# Patient Record
Sex: Male | Born: 1955 | Race: White | Hispanic: No | Marital: Single | Smoking: Never smoker
Health system: Southern US, Community
[De-identification: ages and names within clinical notes are randomized; demographics above are authoritative.]

## PROBLEM LIST (undated history)

## (undated) DIAGNOSIS — K219 Gastro-esophageal reflux disease without esophagitis: Secondary | ICD-10-CM

## (undated) DIAGNOSIS — T8859XA Other complications of anesthesia, initial encounter: Secondary | ICD-10-CM

## (undated) DIAGNOSIS — I10 Essential (primary) hypertension: Secondary | ICD-10-CM

## (undated) DIAGNOSIS — C61 Malignant neoplasm of prostate: Secondary | ICD-10-CM

## (undated) HISTORY — PX: FRACTURE SURGERY: SHX138

## (undated) HISTORY — PX: KNEE SURGERY: SHX244

## (undated) HISTORY — DX: Malignant neoplasm of prostate: C61

## (undated) HISTORY — PX: LITHOTRIPSY: SUR834

---

## 2004-10-12 ENCOUNTER — Ambulatory Visit: Payer: Self-pay | Admitting: Internal Medicine

## 2004-10-12 LAB — HM COLONOSCOPY

## 2007-01-22 ENCOUNTER — Encounter: Payer: Self-pay | Admitting: Family Medicine

## 2007-01-22 LAB — CONVERTED CEMR LAB
BUN: 21 mg/dL
Calcium: 8.8 mg/dL
Cholesterol: 154 mg/dL
HDL: 37 mg/dL
PSA: 1.5 ng/mL
Potassium: 4.7 meq/L
Sodium: 141 meq/L
Triglycerides: 115 mg/dL

## 2008-03-08 ENCOUNTER — Ambulatory Visit: Payer: Self-pay | Admitting: Family Medicine

## 2008-03-10 ENCOUNTER — Encounter: Payer: Self-pay | Admitting: Family Medicine

## 2008-03-10 LAB — CONVERTED CEMR LAB
Cholesterol: 144 mg/dL
Creatinine, Ser: 1.1 mg/dL
HDL: 35 mg/dL
LDL Cholesterol: 93 mg/dL
Triglycerides: 82 mg/dL

## 2009-08-16 ENCOUNTER — Ambulatory Visit: Payer: Self-pay | Admitting: Family Medicine

## 2009-08-16 DIAGNOSIS — K219 Gastro-esophageal reflux disease without esophagitis: Secondary | ICD-10-CM | POA: Insufficient documentation

## 2009-08-16 DIAGNOSIS — H579 Unspecified disorder of eye and adnexa: Secondary | ICD-10-CM | POA: Insufficient documentation

## 2009-08-16 DIAGNOSIS — J309 Allergic rhinitis, unspecified: Secondary | ICD-10-CM | POA: Insufficient documentation

## 2009-08-16 DIAGNOSIS — K8020 Calculus of gallbladder without cholecystitis without obstruction: Secondary | ICD-10-CM | POA: Insufficient documentation

## 2009-09-04 ENCOUNTER — Encounter: Payer: Self-pay | Admitting: Family Medicine

## 2009-09-06 ENCOUNTER — Encounter (INDEPENDENT_AMBULATORY_CARE_PROVIDER_SITE_OTHER): Payer: Self-pay | Admitting: *Deleted

## 2009-12-03 ENCOUNTER — Telehealth (INDEPENDENT_AMBULATORY_CARE_PROVIDER_SITE_OTHER): Payer: Self-pay | Admitting: *Deleted

## 2009-12-11 ENCOUNTER — Ambulatory Visit: Payer: Self-pay | Admitting: Family Medicine

## 2009-12-12 LAB — CONVERTED CEMR LAB
ALT: 39 units/L (ref 0–53)
Alkaline Phosphatase: 60 units/L (ref 39–117)
Bilirubin, Direct: 0.2 mg/dL (ref 0.0–0.3)
Calcium: 9.4 mg/dL (ref 8.4–10.5)
Chloride: 103 meq/L (ref 96–112)
Creatinine, Ser: 1.1 mg/dL (ref 0.4–1.5)
HDL: 37.2 mg/dL — ABNORMAL LOW (ref >39.00)
LDL Cholesterol: 124 mg/dL — ABNORMAL HIGH (ref 0–99)
PSA: 4.63 ng/mL — ABNORMAL HIGH (ref 0.10–4.00)
Sodium: 140 meq/L (ref 135–145)
Total Bilirubin: 1.2 mg/dL (ref 0.3–1.2)
Total CHOL/HDL Ratio: 5
Triglycerides: 78 mg/dL (ref 0.0–149.0)

## 2009-12-14 ENCOUNTER — Ambulatory Visit: Payer: Self-pay | Admitting: Family Medicine

## 2010-02-12 NOTE — Progress Notes (Signed)
----   Converted from flag ---- ---- 11/30/2009 1:04 PM, Crawford Givens MD wrote: cmet lipid v17.3 psa v76.49  ---- 11/30/2009 12:22 PM, Liane Comber CMA (AAMA) wrote: Lab orders please! Good Morning! This pt is scheduled for cpx labs Tuesday, which labs to draw and dx codes to use? Thanks Tasha ------------------------------

## 2010-02-12 NOTE — Assessment & Plan Note (Signed)
Summary: NEW PT TO ESTABH/DLO   Vital Signs:  Patient profile:   55 year old male Height:      69 inches Weight:      177 pounds BMI:     26.23 Temp:     98.2 degrees F oral Pulse rate:   84 / minute Pulse rhythm:   regular BP sitting:   122 / 70  (left arm) Cuff size:   regular  Vitals Entered By: Delilah Shan CMA Duncan Dull) (August 16, 2009 9:49 AM) CC: New Patient to Establish   History of Present Illness: Eye watering.  Occ dry sensation.  Going on for the last few months.  Has been cutting a lot of wood for wood stove.  No vision change.  No pain.   Requesting records.    Preventive Screening-Counseling & Management  Alcohol-Tobacco     Smoking Status: quit  Caffeine-Diet-Exercise     Does Patient Exercise: yes      Drug Use:  no.    Allergies (verified): No Known Drug Allergies  Past History:  Family History: Last updated: 08/16/2009 Family History of Arthritis, other blood relative Family History Breast cancer 1st degree relative <50, parents Family History of Colon CA 1st degree relative <60, grandparents Family History of Prostate CA 1st degree relative <50, parents and other blood relative Family History of Stroke F 1st degree relative <60, parents and other blood relative Family History of Heart Disease, parents and grandparents  M dead breast CA F dead, possible prostate CA, h/o heart disease, possible diverticulitis s/p resection with colostomy  Social History: Last updated: 08/16/2009 Marital Status: Married, since 1997 Children: no kids Occupation: Pipe Audiological scientist, Sales executive Former Smoker, 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 Family History of Arthritis, other blood  relative Family History Breast cancer 1st degree relative <50, parents Family History of Colon CA 1st degree relative <60, grandparents Family History of Prostate CA 1st degree relative <50, parents and other blood relative Family History of Stroke F 1st degree relative <60, parents and other blood relative Family History of Heart Disease, parents and grandparents  M dead breast CA F dead, possible prostate CA, h/o heart disease, possible diverticulitis s/p resection with colostomy  Social History: Reviewed history and no changes required. 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  Otherwise negative.    Physical Exam  General:  GEN: nad, alert and oriented HEENT: mucous membranes moist, TM wnl bilateral, nasal epithelium mildly injected, OP wnl, fundus wnl bilateral, EOMI bilateral, minimal conjuctival injection bilateral- limbus sparing, no FB. no purulent discharge  NECK: supple w/o LA CV: rrr.  no murmur PULM: ctab, no inc wob ABD: soft, +bs EXT: no edema SKIN: no acute rash    Impression & Recommendations:  Problem # 1:  UNSPECIFIED DISORDER OF EYE (ICD-379.90) Potential allergic cause.  Use dust mask to see if that helps and consider OTC zaditor as needed.   consider nasal steroids if not relief after  that.   Complete Medication List: 1)  Ranitidine Hcl 150 Mg Tabs (Ranitidine hcl) .... Take 1 tablet by mouth once a day 2)  Cetirizine Hcl 10 Mg Tabs (Cetirizine hcl) .... As needed 3)  Multivitamins Tabs (Multiple vitamin) .... Take 1 tablet by mouth once a day 4)  Vitamin C 500 Mg Tabs (Ascorbic acid) .... Take 1 tablet by mouth once a day 5)  Aspirin 81 Mg Tabs (Aspirin)  .... Take 1 tablet by mouth once a day 6)  Flax Oil (Flaxseed (linseed)) .... Take 1 tablet by mouth once a day 7)  Fish Oil Oil (Fish oil) .Marland Kitchen.. 1200 mg.   takes 2 daily  Patient Instructions: 1)  See if using the mask helps with your eye watering.  If not, try zaditor (or another over the counter med).  Let me know if you continue to have trouble.  I would get a physical this winter or spring.     Preventive Care Screening  Colonoscopy:    Date:  01/14/2003    Results:  normal

## 2010-02-12 NOTE — Assessment & Plan Note (Signed)
Summary: CPX   Vital Signs:  Patient profile:   56 year old male Height:      69 inches Weight:      176.50 pounds BMI:     26.16 Temp:     98.5 degrees F oral Pulse rate:   68 / minute Pulse rhythm:   regular BP sitting:   110 / 86  (left arm) Cuff size:   regular  Vitals Entered By: Delilah Shan CMA Duncan Dull) (December 14, 2009 8:54 AM) CC: CPX   History of Present Illness: CPE- See plan  Occ burping and 'sickly feeling' in throat.  Inc in indigestion.  Better with ranitidine at night.  Sx change with diet.    Elevated PSA with FH of prostate CA (father, in late 68s).  Labs reviewed with patient.  This is the first elevation of PSA for patient.    Allergies: No Known Drug Allergies  Past History:  Past Surgical History: Last updated: 08/16/2009 2005   Kidney stone  Family History: Last updated: 08/16/2009 Family History of Arthritis, other blood relative Family History Breast cancer 1st degree relative <50, parents Family History of Colon CA 1st degree relative <60, grandparents Family History of Prostate CA 1st degree relative <50, parents and other blood relative Family History of Stroke F 1st degree relative <60, parents and other blood relative Family History of Heart Disease, parents and grandparents  M dead breast CA F dead, possible prostate CA, h/o heart disease, possible diverticulitis s/p resection with colostomy  Social History: Last updated: 12/14/2009 Marital Status: Married, since 1997 Children: no kids Occupation: Pipe Audiological scientist, Sales executive Former Smoker, 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  Otherwise negative.    Physical Exam  General:  GEN: nad, alert and oriented HEENT: mucous membranes moist NECK: supple w/o LA CV: rrr.  no murmur PULM: ctab, no inc wob ABD: soft, +bs EXT: no edema SKIN: no acute rash  Rectal:  No external abnormalities noted. Normal sphincter tone. No rectal masses or tenderness. Prostate:  Prostate gland firm and smooth without nodularity, tenderness, mass, asymmetry or induration.   Impression & Recommendations:  Problem # 1:  Preventive Health Care (ICD-V70.0) stool heme neg. Flu and tdap done today.  healthy habits encouraged.  labs reviewed with patient.   Problem # 2:  GERD (ICD-530.81) D/w patient [id number] of head of bed and using randitidine two times a day.  D/w patient [id number] avoidance.  He understood.  Call back as needed.  His updated medication list for this problem includes:    Ranitidine Hcl 150 Mg Tabs (Ranitidine hcl) .Marland Kitchen... Take 1 tablet by mouth once a day  Problem # 3:  SPECIAL SCREENING MALIGNANT NEOPLASM OF PROSTATE (ICD-V76.44) Given FH, will refer to URO.  App uro input.  He understands possible false elevations with PSA, though there is a family history of CA.   Orders: Urology Referral (Urology)  Complete Medication List: 1)  Ranitidine Hcl 150 Mg Tabs (Ranitidine hcl) .... Take 1 tablet by mouth once a day 2)  Cetirizine  Hcl 10 Mg Tabs (Cetirizine hcl) .... As needed 3)  Multivitamins Tabs (Multiple vitamin) .... Take 1 tablet by mouth once a day 4)  Vitamin C 500 Mg Tabs (Ascorbic acid) .... Take 1 tablet by mouth once a day 5)  Aspirin 81 Mg Tabs (Aspirin) .... Take 1 tablet by mouth once a day 6)  Flax Oil (Flaxseed (linseed)) .... Take 1 tablet by mouth once a day 7)  Fish Oil Oil (Fish oil) .Marland Kitchen.. 1200 mg.   takes 2 daily  Other Orders: Tdap => 55yrs IM  (16109) Admin 1st Vaccine (60454) Admin 1st Vaccine (09811) Flu Vaccine 30yrs + [phone])  Patient Instructions: 1)  I would elevate the head of your bed and take the ranitidine two times a day.   2)  See Shirlee Limerick about your referral before your leave today.   3)  Take care.  I was glad to see you today.    Orders Added: 1)  Est. Patient 40-64 years [99396] 2)  Est. Patient Level III [99213] 3)  Urology Referral [Urology] 4)  Tdap => 75yrs IM [90715] 5)  Admin 1st Vaccine [90471] 6)  Admin 1st Vaccine [90471] 7)  Flu Vaccine 65yrs + [29562]   Immunizations Administered:  Tetanus Vaccine:    Vaccine Type: Tdap    Site: left deltoid    Mfr: GlaxoSmithKline    Dose: 0.5 ml    Route: IM    Given by: Delilah Shan CMA (AAMA)    Exp. Date: 11/10/2011    Lot #: [id number]    VIS given: 12/01/07 version given December 14, 2009.   Immunizations Administered:  Tetanus Vaccine:    Vaccine Type: Tdap    Site: left deltoid    Mfr: GlaxoSmithKline    Dose: 0.5 ml    Route: IM    Given by: Delilah Shan CMA (AAMA)    Exp. Date: 11/10/2011    Lot #: [id number]    VIS given: 12/01/07 version given December 14, 2009.  Current Allergies (reviewed today): No known allergies  Flu Vaccine Consent Questions     Do you have a history of severe allergic reactions to this vaccine? no    Any prior history of allergic reactions to egg and/or gelatin? no    Do you have a sensitivity to the preservative Thimersol? no    Do you have a past history of Guillan-Barre Syndrome? no    Do you currently have an acute febrile illness? no    Have you ever had a severe reaction to latex? no    Vaccine information given and explained to patient? yes    Are you currently pregnant? no    Lot Number:[id number]   Exp Date:07/13/2010   Site Given  Left Deltoid IM.lbflu Lugene Fuquay CMA (AAMA)  December 14, 2009 11:06 AM

## 2010-02-12 NOTE — Miscellaneous (Signed)
  Clinical Lists Changes  Observations: Added new observation of HGB: 14.9 g/dL (04/54/0981 19:14) Added new observation of PSA: 1.4 ng/mL (03/10/2008 16:45) Added new observation of CREATININE: 1.10 mg/dL (78/29/5621 30:86) Added new observation of BUN: 25 mg/dL (57/84/6962 95:28) Added new observation of BG RANDOM: 83 mg/dL (41/32/4401 02:72) Added new observation of LDL: 93 mg/dL (53/66/4403 47:42) Added new observation of HDL: 35 mg/dL (59/56/3875 64:33) Added new observation of TRIGLYC TOT: 82 mg/dL (29/51/8841 66:06) Added new observation of CHOLESTEROL: 144 mg/dL (30/16/0109 32:35) Added new observation of PSA: 1.5 ng/mL (01/22/2007 16:45) Added new observation of CALCIUM: 8.8 mg/dL (57/32/2025 42:70) Added new observation of PROTEIN, TOT: 7.0 g/dL (62/37/6283 15:17) Added new observation of CREATININE: 1.12 mg/dL (61/60/7371 06:26) Added new observation of BUN: 21 mg/dL (94/85/4627 03:50) Added new observation of BG RANDOM: 77 mg/dL (09/38/1829 93:71) Added new observation of CL SERUM: 105 meq/L (01/22/2007 16:45) Added new observation of K SERUM: 4.7 meq/L (01/22/2007 16:45) Added new observation of NA: 141 meq/L (01/22/2007 16:45) Added new observation of LDL: 94 mg/dL (69/67/8938 10:17) Added new observation of HDL: 37 mg/dL (51/02/5850 77:82) Added new observation of TRIGLYC TOT: 115 mg/dL (42/35/3614 43:15) Added new observation of CHOLESTEROL: 154 mg/dL (40/08/6759 95:09)

## 2010-02-12 NOTE — Miscellaneous (Signed)
  Clinical Lists Changes  Observations: Added new observation of COLONOSCOPY: Diverticulosis without evidence of diverticular bleeding (10/12/2004 10:15)      Preventive Care Screening  Colonoscopy:    Date:  10/12/2004    Results:  Diverticulosis without evidence of diverticular bleeding

## 2010-12-28 ENCOUNTER — Ambulatory Visit: Payer: Self-pay | Admitting: Orthopedic Surgery

## 2011-03-25 ENCOUNTER — Ambulatory Visit: Payer: Self-pay | Admitting: Orthopedic Surgery

## 2011-03-25 LAB — SEDIMENTATION RATE: Erythrocyte Sed Rate: 9 mm/hr (ref 0–20)

## 2011-04-01 ENCOUNTER — Ambulatory Visit: Payer: Self-pay | Admitting: Orthopedic Surgery

## 2013-01-21 ENCOUNTER — Ambulatory Visit: Payer: Self-pay | Admitting: Family Medicine

## 2013-01-23 ENCOUNTER — Ambulatory Visit: Payer: Self-pay | Admitting: Physician Assistant

## 2013-03-15 ENCOUNTER — Telehealth: Payer: Self-pay | Admitting: *Deleted

## 2013-03-25 NOTE — Telephone Encounter (Signed)
Open in error

## 2014-05-07 NOTE — Op Note (Signed)
PATIENT NAME:  Cody Rush, Cody Rush MR#:  [id number] DATE OF BIRTH:  1955/02/21  DATE OF PROCEDURE:  04/01/2011  PREOPERATIVE DIAGNOSIS: Left knee medial meniscal tear.   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   PROCEDURE:  Arthroscopic partial medial meniscectomy and chondroplasty of the medial femoral condyle and patellofemoral joint, left knee.   SURGEON:  Thornton Park, M.D.   ANESTHESIA: General with LMA plus local injection of the incision sites with 1% lidocaine plain and an intraarticular injection of 0.25% Marcaine with epinephrine x 20 mL.   SPECIMENS: None.   COMPLICATIONS: None.   INDICATIONS FOR PROCEDURE: Cody Rush is a 59 year old male who has had over five months of left knee pain. This prevented him from staying active with hiking and jogging. He had mechanical symptoms such as clicking and popping. The pain was isolated to the medial side. An MRI was suggestive of a medial meniscal tear. Given the longevity of his symptoms and the mechanical clicking and popping, the patient decided to proceed with arthroscopic partial medial meniscectomy.   The risks and benefits of the procedure were reviewed with the patient. He understands the risks include infection, bleeding, nerve or blood vessel injury, knee stiffness, osteoarthritis, persistent knee pain, the need for further surgery, deep venous thrombosis and pulmonary embolism as well as myocardial infarction, stroke, pneumonia, respiratory failure, and death.   PROCEDURE NOTE: The patient was brought to the operating room after I spoke with him in the preoperative area. I had signed the left leg with the word "yes" according to the right site protocol. I updated the patient's history and physical. I had verbally confirmed with the patient that the left knee was the correct site of surgery, and this was confirmed with my office notes  and radiographic studies as well.   The patient was prepped and draped in sterile fashion after all bony prominences were adequately padded. He had undergone general anesthesia with LMA. A time-out was performed to verify the patient's name, date of birth, medical record number, correct site of surgery, and correct procedure to be performed. It was also used to verify the patient had received antibiotics and that all appropriate instruments, implants, and radiographic studies were available in the room. Once all in attendance were in agreement, the case began.   The patient had proposed arthroscopy sites drawn out with a surgical marker based on bony landmarks. These were pre-injected with 1% lidocaine plain for a total of 10 mL. The patient also had received 2 grams of Kefzol prior to the onset of the incision.    A #11 blade was used to establish inferior lateral and inferior medial portals. The arthroscope was placed through the inferolateral portal and a full diagnostic examination of the knee was performed. During this diagnostic examination an 18-gauge spinal needle was used to localize the medial portal and an 11 blade was used to make this incision.   The arthroscopic evaluation of the knee included the suprapatellar pouch, the patellofemoral joint, the medial and lateral gutters, the middle and lateral compartments, the intercondylar notch, and the posterior knee.   Findings on arthroscopy included a tear of the posterior horn of the medial meniscus extending into the body of the meniscus. This primarily involved an undersurface tear which was unstable. The patient also had extensive grade 2 and 3 changes of the medial femoral condyle and patellofemoral joint as well.   The attention was  turned to addressing the grade 3 chondral changes of the medial femoral condyle. Chondroplasty was performed to remove all loose edges of cartilage and any fraying of the medial femoral condyle. The medial  meniscus was then addressed using a straight basket and 4-0 resector shaver blade. The meniscus was then probed to ensure a stable rim. The meniscus was also contoured to avoid any sharp edges which may lead to further tearing. The patellofemoral joint then also underwent a chondroplasty to remove any loose or frayed pieces of cartilage. The knee was then copiously irrigated and lavaged. All arthroscopic instruments were then removed. 4-0 nylon sutures were placed in the medial and lateral portal incisions. A dry sterile dressing was applied. 20 mL of 0.25% Marcaine plain was injected intra-articularly at the conclusion of the case before a dry sterile bandage was applied. The patient was awakened and brought to the PAC-U in stable condition. I was scrubbed and present for the entire case and all sharp and instrument counts were correct at the conclusion of the case. I spoke with the patient's wife in the postoperative consultation room to let her know that the case had gone without complication and the patient was stable in the recovery room.    ____________________________ Timoteo Gaul, MD klk:[id number] D: 04/01/2011 11:03:37 ET T: 04/01/2011 12:35:13 ET JOB#: [id number]  cc: Timoteo Gaul, MD, <Dictator> Timoteo Gaul MD ELECTRONICALLY SIGNED 04/04/2011 9:19

## 2014-05-26 LAB — HEPATIC FUNCTION PANEL
ALK PHOS: 53 U/L (ref 25–125)
ALT: 29 U/L (ref 10–40)
AST: 27 U/L (ref 14–40)
Bilirubin, Total: 0.6 mg/dL

## 2014-05-26 LAB — CBC AND DIFFERENTIAL
HEMATOCRIT: 43 % (ref 41–53)
HEMOGLOBIN: 15.4 g/dL (ref 13.5–17.5)
NEUTROS ABS: 2 /uL
PLATELETS: 183 10*3/uL (ref 150–399)
WBC: 3.9 10*3/mL

## 2014-05-26 LAB — TSH: TSH: 2.19 u[IU]/mL (ref 0.41–5.90)

## 2014-05-26 LAB — BASIC METABOLIC PANEL
BUN: 19 mg/dL (ref 4–21)
Creatinine: 1.2 mg/dL (ref 0.6–1.3)
GLUCOSE: 90 mg/dL
POTASSIUM: 4.9 mmol/L (ref 3.4–5.3)
Sodium: 140 mmol/L (ref 137–147)

## 2014-05-26 LAB — LIPID PANEL
CHOLESTEROL: 148 mg/dL (ref 0–200)
HDL: 36 mg/dL (ref 35–70)
LDL Cholesterol: 94 mg/dL
LDl/HDL Ratio: 2.6
TRIGLYCERIDES: 92 mg/dL (ref 40–160)

## 2014-05-26 LAB — PSA: PSA: 2.8

## 2014-05-26 LAB — HM HEPATITIS C SCREENING LAB: HM Hepatitis Screen: NEGATIVE

## 2015-02-01 DIAGNOSIS — R030 Elevated blood-pressure reading, without diagnosis of hypertension: Secondary | ICD-10-CM | POA: Insufficient documentation

## 2015-02-01 DIAGNOSIS — Z87898 Personal history of other specified conditions: Secondary | ICD-10-CM | POA: Insufficient documentation

## 2015-02-01 DIAGNOSIS — K219 Gastro-esophageal reflux disease without esophagitis: Secondary | ICD-10-CM | POA: Insufficient documentation

## 2015-02-01 DIAGNOSIS — M722 Plantar fascial fibromatosis: Secondary | ICD-10-CM | POA: Insufficient documentation

## 2015-02-01 DIAGNOSIS — E785 Hyperlipidemia, unspecified: Secondary | ICD-10-CM | POA: Insufficient documentation

## 2015-02-01 DIAGNOSIS — J309 Allergic rhinitis, unspecified: Secondary | ICD-10-CM | POA: Insufficient documentation

## 2015-02-01 DIAGNOSIS — K5790 Diverticulosis of intestine, part unspecified, without perforation or abscess without bleeding: Secondary | ICD-10-CM | POA: Insufficient documentation

## 2015-02-01 DIAGNOSIS — K759 Inflammatory liver disease, unspecified: Secondary | ICD-10-CM | POA: Insufficient documentation

## 2015-02-01 DIAGNOSIS — Z87442 Personal history of urinary calculi: Secondary | ICD-10-CM | POA: Insufficient documentation

## 2015-03-07 ENCOUNTER — Encounter: Payer: Self-pay | Admitting: Family Medicine

## 2015-03-07 ENCOUNTER — Ambulatory Visit (INDEPENDENT_AMBULATORY_CARE_PROVIDER_SITE_OTHER): Payer: 59 | Admitting: Family Medicine

## 2015-03-07 VITALS — BP 130/68 | Temp 98.6°F | Resp 16 | Wt 185.0 lb

## 2015-03-07 DIAGNOSIS — C4491 Basal cell carcinoma of skin, unspecified: Secondary | ICD-10-CM | POA: Diagnosis not present

## 2015-03-07 NOTE — Progress Notes (Signed)
Patient ID: Cody Rush, male   DOB: 13-Jan-1956, 60 y.o.   MRN: [id number]        Patient: Cody Rush Male    DOB: 04/16/1955   60 y.o.   MRN: [id number] Visit Date: 03/07/2015  Today's Provider: [name], MD   Chief Complaint  Patient presents with  . Rash   Subjective:    Rash This is a new problem. The current episode started more than 1 month ago. The problem is unchanged. The affected locations include the face. The rash is characterized by dryness, scaling and redness. He was exposed to nothing. Pertinent negatives include no facial edema, fever or joint pain. Past treatments include nothing.  area he is concerned about is where glasses still on his right side of his nose     No Known Allergies Previous Medications   ASPIRIN 81 MG TABLET    Take by mouth.   LANSOPRAZOLE (PREVACID) 30 MG CAPSULE    Take by mouth. Reported on 03/07/2015   MULTIPLE VITAMINS PO    Take by mouth.   OMEGA-3 FATTY ACIDS (FISH OIL) 1200 MG CAPS    Take by mouth.   OMEPRAZOLE (PRILOSEC) 20 MG CAPSULE    Take by mouth.    Review of Systems  Constitutional: Negative for fever.  Musculoskeletal: Negative.  Negative for joint pain.  Skin: Positive for rash.  Neurological: Negative.   Hematological: Negative.   Psychiatric/Behavioral: Negative.     Social History  Substance Use Topics  . Smoking status: Never Smoker   . Smokeless tobacco: Not on file  . Alcohol Use: No   Objective:   BP 130/68 mmHg  Temp(Src) 98.6 F (37 C)  Resp 16  Wt 185 lb (83.915 kg)  Physical Exam  Constitutional: He appears well-developed and well-nourished.  Cardiovascular: Normal rate, regular rhythm, normal heart sounds and intact distal pulses.   possible basal cell carcinoma of left side of nose. Skin tags under both axilla. 2 larger ones on her right axilla are frozen as a irritate him under his T-shirt      Assessment & Plan:     1. Basal cell carcinoma --possible --irritated  presently Lef nasal alae - Ambulatory referral to Dermatology 2. Skin tag Irritated per patient in axilla     I have done the exam and reviewed the above chart and it is accurate to the best of my knowledge.    Richard Cranford Mon, MD  [facility]

## 2015-03-28 ENCOUNTER — Other Ambulatory Visit: Payer: Self-pay | Admitting: Emergency Medicine

## 2015-03-28 DIAGNOSIS — J40 Bronchitis, not specified as acute or chronic: Secondary | ICD-10-CM

## 2015-03-28 MED ORDER — AZITHROMYCIN 250 MG PO TABS: ORAL_TABLET | ORAL | Status: DC

## 2015-05-03 ENCOUNTER — Ambulatory Visit (INDEPENDENT_AMBULATORY_CARE_PROVIDER_SITE_OTHER): Payer: 59 | Admitting: Family Medicine

## 2015-05-03 ENCOUNTER — Encounter: Payer: Self-pay | Admitting: Family Medicine

## 2015-05-03 VITALS — BP 118/72 | HR 72 | Temp 98.1°F | Resp 16 | Ht 69.0 in | Wt 179.0 lb

## 2015-05-03 DIAGNOSIS — Z1211 Encounter for screening for malignant neoplasm of colon: Secondary | ICD-10-CM

## 2015-05-03 DIAGNOSIS — Z0000 Encounter for general adult medical examination without abnormal findings: Secondary | ICD-10-CM

## 2015-05-03 DIAGNOSIS — Z125 Encounter for screening for malignant neoplasm of prostate: Secondary | ICD-10-CM

## 2015-05-03 DIAGNOSIS — Z23 Encounter for immunization: Secondary | ICD-10-CM

## 2015-05-03 LAB — POCT URINALYSIS DIPSTICK
BILIRUBIN UA: NEGATIVE
Glucose, UA: NEGATIVE
KETONES UA: NEGATIVE
Leukocytes, UA: NEGATIVE
Nitrite, UA: NEGATIVE
PH UA: 5
PROTEIN UA: NEGATIVE
RBC UA: NEGATIVE
Spec Grav, UA: 1.025
Urobilinogen, UA: 0.2

## 2015-05-03 NOTE — Progress Notes (Signed)
Patient ID: Cody Rush, male   DOB: 05/09/1955, 60 y.o.   MRN: [id number]       Patient: Cody Rush, Male    DOB: September 25, 1955, 60 y.o.   MRN: [id number] Visit Date: 05/03/2015  Today's Provider: [name], MD   Chief Complaint  Patient presents with  . Annual Exam   Subjective:    Annual physical exam Cody Rush is a 60 y.o. male who presents today for health maintenance and complete physical. He feels well. He reports exercising 2-3 times a week, bicycling and ellipitcal. He reports he is sleeping well.  ----------------------------------------------------------------- Colonoscopy- 10/12/04 diverticulosis, no path  Immunization History  Administered Date(s) Administered  . Influenza Whole 12/14/2009  . Td 12/14/2009  . Tdap 03/08/2008     Review of Systems  Constitutional: Negative.   HENT: Negative.   Eyes: Negative.   Respiratory: Negative.   Cardiovascular: Negative.   Gastrointestinal: Negative.   Endocrine: Negative.   Genitourinary: Negative.   Musculoskeletal: Negative.   Skin: Negative.   Allergic/Immunologic: Negative.   Neurological: Negative.   Hematological: Negative.   Psychiatric/Behavioral: Negative.   for the past few years patient is getting some progressive mild stiffng up.ess in his neck. Occasional mild vertigo specially when looking down or looking up.  Social History      He  reports that he has never smoked. He does not have any smokeless tobacco history on file. He reports that he does not drink alcohol or use illicit drugs.       Social History   Social History  . Marital Status: Single    Spouse Name: N/A  . Number of Children: N/A  . Years of Education: N/A   Social History Main Topics  . Smoking status: Never Smoker   . Smokeless tobacco: None  . Alcohol Use: No  . Drug Use: No  . Sexual Activity: Not Asked   Other Topics Concern  . None   Social History Narrative    History reviewed. No  pertinent past medical history.   Patient Active Problem List   Diagnosis Date Noted  . Allergic rhinitis 02/01/2015  . Borderline blood pressure 02/01/2015  . DD (diverticular disease) 02/01/2015  . Acid reflux 02/01/2015  . Hepatitis 02/01/2015  . H/O disease 02/01/2015  . H/O renal calculi 02/01/2015  . HLD (hyperlipidemia) 02/01/2015  . Plantar fasciitis 02/01/2015  . UNSPECIFIED DISORDER OF EYE 08/16/2009  . ALLERGIC RHINITIS 08/16/2009  . GERD 08/16/2009  . CHOLELITHIASIS 08/16/2009    Past Surgical History  Procedure Laterality Date  . Knee surgery    . Lithotripsy      Family History        Family Status  Relation Status Death Age  . Mother Deceased 57  . Father Deceased 41  . Sister Alive   . Sister Alive         His family history includes Breast cancer in his mother; Colon cancer in his father; Heart disease in his father; Stroke in his father.    No Known Allergies  Previous Medications   ASPIRIN 81 MG TABLET    Take by mouth.   LANSOPRAZOLE (PREVACID) 30 MG CAPSULE    Take by mouth. Reported on 03/07/2015   MULTIPLE VITAMINS PO    Take by mouth.   OMEGA-3 FATTY ACIDS (FISH OIL) 1200 MG CAPS    Take by mouth.   OMEPRAZOLE (PRILOSEC) 20 MG CAPSULE    Take by mouth.  Patient Care Team: Jerrol Banana., MD as PCP - General (Family Medicine)     Objective:   Vitals: BP 118/72 mmHg  Pulse 72  Temp(Src) 98.1 F (36.7 C) (Oral)  Resp 16  Ht 5\' 9"  (1.753 m)  Wt 179 lb (81.194 kg)  BMI 26.42 kg/m2   Physical Exam  Constitutional: He is oriented to person, place, and time. He appears well-developed and well-nourished.  HENT:  Head: Normocephalic and atraumatic.  Right Ear: External ear normal.  Left Ear: External ear normal.  Nose: Nose normal.  Mouth/Throat: Oropharynx is clear and moist.  Eyes: Conjunctivae and EOM are normal. Pupils are equal, round, and reactive to light.  Neck: Normal range of motion. Neck supple.  Cardiovascular:  Normal rate, regular rhythm, normal heart sounds and intact distal pulses.   Pulmonary/Chest: Effort normal and breath sounds normal.  Abdominal: Soft. Bowel sounds are normal.  Genitourinary: Rectum normal, prostate normal and penis normal.  Musculoskeletal: Normal range of motion.  Neurological: He is alert and oriented to person, place, and time. He has normal reflexes.  Skin: Skin is warm and dry.  Psychiatric: He has a normal mood and affect. His behavior is normal. Judgment and thought content normal.  DRE : Normal. She does have a slight bulge in the right inguinal canal with coughing which indicates very small right inguinal hernia  Depression Screen No flowsheet data found.    Assessment & Plan:     Routine Health Maintenance and Physical Exam  Exercise Activities and Dietary recommendations Goals    None      Immunization History  Administered Date(s) Administered  . Influenza Whole 12/14/2009  . Td 12/14/2009  . Tdap 03/08/2008    Health Maintenance  Topic Date Due  . Hepatitis C Screening  1955/10/11  . HIV Screening  12/04/1968  . ZOSTAVAX  12/04/2013  . COLONOSCOPY  10/13/2014  . INFLUENZA VACCINE  08/14/2015  . TETANUS/TDAP  12/15/2019       Discussed health benefits of physical activity, and encouraged him to engage in regular exercise appropriate for his age and condition.              Small inguinal hernia on right              Mild vertigo              Mild neck stiffness/chronic cervical strain              Mild vertigo   --------------------------------------------------------------------

## 2015-05-05 LAB — CBC WITH DIFFERENTIAL/PLATELET
BASOS ABS: 0 10*3/uL (ref 0.0–0.2)
Basos: 1 %
EOS (ABSOLUTE): 0.2 10*3/uL (ref 0.0–0.4)
Eos: 4 %
HEMATOCRIT: 45.2 % (ref 37.5–51.0)
Hemoglobin: 15.6 g/dL (ref 12.6–17.7)
Immature Grans (Abs): 0 10*3/uL (ref 0.0–0.1)
Immature Granulocytes: 0 %
LYMPHS ABS: 1.2 10*3/uL (ref 0.7–3.1)
Lymphs: 26 %
MCH: 30.8 pg (ref 26.6–33.0)
MCHC: 34.5 g/dL (ref 31.5–35.7)
MCV: 89 fL (ref 79–97)
MONOS ABS: 0.6 10*3/uL (ref 0.1–0.9)
Monocytes: 12 %
NEUTROS ABS: 2.6 10*3/uL (ref 1.4–7.0)
Neutrophils: 57 %
Platelets: 209 10*3/uL (ref 150–379)
RBC: 5.06 x10E6/uL (ref 4.14–5.80)
RDW: 12.6 % (ref 12.3–15.4)
WBC: 4.6 10*3/uL (ref 3.4–10.8)

## 2015-05-05 LAB — PSA: PROSTATE SPECIFIC AG, SERUM: 2.8 ng/mL (ref 0.0–4.0)

## 2015-05-05 LAB — COMPREHENSIVE METABOLIC PANEL
ALK PHOS: 61 IU/L (ref 39–117)
ALT: 40 IU/L (ref 0–44)
AST: 31 IU/L (ref 0–40)
Albumin/Globulin Ratio: 1.6 (ref 1.2–2.2)
Albumin: 4.3 g/dL (ref 3.6–4.8)
BUN/Creatinine Ratio: 20 (ref 10–24)
BUN: 23 mg/dL (ref 8–27)
Bilirubin Total: 0.7 mg/dL (ref 0.0–1.2)
CALCIUM: 9.3 mg/dL (ref 8.6–10.2)
CO2: 23 mmol/L (ref 18–29)
CREATININE: 1.14 mg/dL (ref 0.76–1.27)
Chloride: 100 mmol/L (ref 96–106)
GFR calc Af Amer: 80 mL/min/{1.73_m2} (ref >59)
GFR, EST NON AFRICAN AMERICAN: 69 mL/min/{1.73_m2} (ref >59)
GLUCOSE: 91 mg/dL (ref 65–99)
Globulin, Total: 2.7 g/dL (ref 1.5–4.5)
Potassium: 4.9 mmol/L (ref 3.5–5.2)
SODIUM: 140 mmol/L (ref 134–144)
Total Protein: 7 g/dL (ref 6.0–8.5)

## 2015-05-05 LAB — LIPID PANEL WITH LDL/HDL RATIO
Cholesterol, Total: 177 mg/dL (ref 100–199)
HDL: 39 mg/dL — ABNORMAL LOW (ref >39)
LDL Calculated: 117 mg/dL — ABNORMAL HIGH (ref 0–99)
LDL/HDL RATIO: 3 ratio (ref 0.0–3.6)
TRIGLYCERIDES: 103 mg/dL (ref 0–149)
VLDL Cholesterol Cal: 21 mg/dL (ref 5–40)

## 2015-05-05 LAB — TSH: TSH: 2.47 u[IU]/mL (ref 0.450–4.500)

## 2015-05-21 ENCOUNTER — Other Ambulatory Visit: Payer: Self-pay

## 2015-05-21 MED ORDER — LANSOPRAZOLE 30 MG PO CPDR: 30.0000 mg | DELAYED_RELEASE_CAPSULE | Freq: Every day | ORAL | Status: DC

## 2015-06-26 LAB — HM COLONOSCOPY

## 2015-07-24 ENCOUNTER — Encounter: Payer: Self-pay | Admitting: Family Medicine

## 2015-11-14 HISTORY — PX: THORACOSCOPY: SUR1347

## 2015-11-15 ENCOUNTER — Encounter: Payer: Self-pay | Admitting: Emergency Medicine

## 2015-11-15 ENCOUNTER — Emergency Department: Payer: 59

## 2015-11-15 ENCOUNTER — Emergency Department
Admission: EM | Admit: 2015-11-15 | Discharge: 2015-11-15 | Disposition: A | Discharge status: Other Acute Inpatient Hospital | Payer: 59 | Attending: Emergency Medicine | Admitting: Emergency Medicine

## 2015-11-15 DIAGNOSIS — S82142A Displaced bicondylar fracture of left tibia, initial encounter for closed fracture: Secondary | ICD-10-CM | POA: Insufficient documentation

## 2015-11-15 DIAGNOSIS — Y9355 Activity, bike riding: Secondary | ICD-10-CM | POA: Insufficient documentation

## 2015-11-15 DIAGNOSIS — S2242XA Multiple fractures of ribs, left side, initial encounter for closed fracture: Secondary | ICD-10-CM

## 2015-11-15 DIAGNOSIS — Y92410 Unspecified street and highway as the place of occurrence of the external cause: Secondary | ICD-10-CM | POA: Diagnosis not present

## 2015-11-15 DIAGNOSIS — S299XXA Unspecified injury of thorax, initial encounter: Secondary | ICD-10-CM | POA: Diagnosis present

## 2015-11-15 DIAGNOSIS — Y999 Unspecified external cause status: Secondary | ICD-10-CM | POA: Insufficient documentation

## 2015-11-15 DIAGNOSIS — Z87891 Personal history of nicotine dependence: Secondary | ICD-10-CM | POA: Diagnosis not present

## 2015-11-15 DIAGNOSIS — Z7982 Long term (current) use of aspirin: Secondary | ICD-10-CM | POA: Insufficient documentation

## 2015-11-15 DIAGNOSIS — Z79899 Other long term (current) drug therapy: Secondary | ICD-10-CM | POA: Insufficient documentation

## 2015-11-15 LAB — COMPREHENSIVE METABOLIC PANEL
ALBUMIN: 4.7 g/dL (ref 3.5–5.0)
ALK PHOS: 63 U/L (ref 38–126)
ALT: 49 U/L (ref 17–63)
ANION GAP: 8 (ref 5–15)
AST: 52 U/L — ABNORMAL HIGH (ref 15–41)
BILIRUBIN TOTAL: 1.8 mg/dL — AB (ref 0.3–1.2)
BUN: 24 mg/dL — AB (ref 6–20)
CALCIUM: 9.4 mg/dL (ref 8.9–10.3)
CO2: 27 mmol/L (ref 22–32)
Chloride: 103 mmol/L (ref 101–111)
Creatinine, Ser: 1.21 mg/dL (ref 0.61–1.24)
GFR calc Af Amer: >60 mL/min (ref >60)
GFR calc non Af Amer: >60 mL/min (ref >60)
GLUCOSE: 126 mg/dL — AB (ref 65–99)
Potassium: 4.9 mmol/L (ref 3.5–5.1)
Sodium: 138 mmol/L (ref 135–145)
TOTAL PROTEIN: 8.4 g/dL — AB (ref 6.5–8.1)

## 2015-11-15 LAB — URINALYSIS COMPLETE WITH MICROSCOPIC (ARMC ONLY)
BACTERIA UA: NONE SEEN
Bilirubin Urine: NEGATIVE
Glucose, UA: NEGATIVE mg/dL
Leukocytes, UA: NEGATIVE
Nitrite: NEGATIVE
PH: 5 (ref 5.0–8.0)
PROTEIN: 30 mg/dL — AB
SQUAMOUS EPITHELIAL / LPF: NONE SEEN
Specific Gravity, Urine: 1.027 (ref 1.005–1.030)

## 2015-11-15 LAB — CBC WITH DIFFERENTIAL/PLATELET
BASOS PCT: 1 %
Basophils Absolute: 0.1 10*3/uL (ref 0–0.1)
EOS ABS: 0 10*3/uL (ref 0–0.7)
EOS PCT: 0 %
HCT: 46.6 % (ref 40.0–52.0)
Hemoglobin: 16.4 g/dL (ref 13.0–18.0)
Lymphocytes Relative: 4 %
Lymphs Abs: 0.5 10*3/uL — ABNORMAL LOW (ref 1.0–3.6)
MCH: 31.8 pg (ref 26.0–34.0)
MCHC: 35.2 g/dL (ref 32.0–36.0)
MCV: 90.2 fL (ref 80.0–100.0)
MONO ABS: 0.8 10*3/uL (ref 0.2–1.0)
MONOS PCT: 5 %
NEUTROS PCT: 90 %
Neutro Abs: 14 10*3/uL — ABNORMAL HIGH (ref 1.4–6.5)
Platelets: 225 10*3/uL (ref 150–440)
RBC: 5.17 MIL/uL (ref 4.40–5.90)
RDW: 13.6 % (ref 11.5–14.5)
WBC: 15.4 10*3/uL — ABNORMAL HIGH (ref 3.8–10.6)

## 2015-11-15 LAB — PROTIME-INR
INR: 1.01
PROTHROMBIN TIME: 13.3 s (ref 11.4–15.2)

## 2015-11-15 LAB — ETHANOL: Alcohol, Ethyl (B): <5 mg/dL (ref <5)

## 2015-11-15 MED ORDER — ONDANSETRON HCL 4 MG/2ML IJ SOLN
4.0000 mg | Freq: Once | INTRAMUSCULAR | Status: AC
  Administered 2015-11-15: 4 mg via INTRAVENOUS
  Filled 2015-11-15: qty 2

## 2015-11-15 MED ORDER — HYDROMORPHONE HCL 1 MG/ML IJ SOLN
1.0000 mg | Freq: Once | INTRAMUSCULAR | Status: AC
  Administered 2015-11-15: 1 mg via INTRAVENOUS
  Filled 2015-11-15: qty 1

## 2015-11-15 MED ORDER — HYDROMORPHONE HCL 1 MG/ML IJ SOLN
0.5000 mg | Freq: Once | INTRAMUSCULAR | Status: AC
  Administered 2015-11-15: 0.5 mg via INTRAVENOUS

## 2015-11-15 MED ORDER — HYDROMORPHONE HCL 1 MG/ML IJ SOLN
1.0000 mg | Freq: Once | INTRAMUSCULAR | Status: DC
  Filled 2015-11-15: qty 1
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
## 2015-11-16 DIAGNOSIS — S2242XA Multiple fractures of ribs, left side, initial encounter for closed fracture: Secondary | ICD-10-CM | POA: Insufficient documentation

## 2015-11-16 DIAGNOSIS — T1490XA Injury, unspecified, initial encounter: Secondary | ICD-10-CM | POA: Insufficient documentation

## 2015-11-16 DIAGNOSIS — S2223XA Sternal manubrial dissociation, initial encounter for closed fracture: Secondary | ICD-10-CM | POA: Insufficient documentation

## 2015-11-16 DIAGNOSIS — S82142A Displaced bicondylar fracture of left tibia, initial encounter for closed fracture: Secondary | ICD-10-CM | POA: Insufficient documentation

## 2015-11-22 ENCOUNTER — Telehealth: Payer: Self-pay | Admitting: Family Medicine

## 2015-11-22 NOTE — Telephone Encounter (Signed)
ok 
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
## 2015-11-22 NOTE — Telephone Encounter (Signed)
See below-aa 

## 2015-11-29 ENCOUNTER — Encounter: Payer: Self-pay | Admitting: Family Medicine

## 2015-11-29 ENCOUNTER — Ambulatory Visit (INDEPENDENT_AMBULATORY_CARE_PROVIDER_SITE_OTHER): Payer: 59 | Admitting: Family Medicine

## 2015-11-29 DIAGNOSIS — S2249XD Multiple fractures of ribs, unspecified side, subsequent encounter for fracture with routine healing: Secondary | ICD-10-CM | POA: Diagnosis not present

## 2015-11-29 DIAGNOSIS — S2242XA Multiple fractures of ribs, left side, initial encounter for closed fracture: Secondary | ICD-10-CM | POA: Diagnosis not present
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
## 2016-01-23 DIAGNOSIS — M79662 Pain in left lower leg: Secondary | ICD-10-CM | POA: Diagnosis not present

## 2016-01-23 DIAGNOSIS — S82142D Displaced bicondylar fracture of left tibia, subsequent encounter for closed fracture with routine healing: Secondary | ICD-10-CM | POA: Diagnosis not present

## 2016-01-25 DIAGNOSIS — M79662 Pain in left lower leg: Secondary | ICD-10-CM | POA: Diagnosis not present

## 2016-01-25 DIAGNOSIS — S82142D Displaced bicondylar fracture of left tibia, subsequent encounter for closed fracture with routine healing: Secondary | ICD-10-CM | POA: Diagnosis not present

## 2016-01-29 DIAGNOSIS — M25462 Effusion, left knee: Secondary | ICD-10-CM | POA: Diagnosis not present

## 2016-01-29 DIAGNOSIS — S82142D Displaced bicondylar fracture of left tibia, subsequent encounter for closed fracture with routine healing: Secondary | ICD-10-CM | POA: Diagnosis not present

## 2016-01-29 DIAGNOSIS — M79662 Pain in left lower leg: Secondary | ICD-10-CM | POA: Diagnosis not present

## 2016-02-01 DIAGNOSIS — S82142D Displaced bicondylar fracture of left tibia, subsequent encounter for closed fracture with routine healing: Secondary | ICD-10-CM | POA: Diagnosis not present

## 2016-02-01 DIAGNOSIS — M79662 Pain in left lower leg: Secondary | ICD-10-CM | POA: Diagnosis not present

## 2016-02-05 DIAGNOSIS — S82142D Displaced bicondylar fracture of left tibia, subsequent encounter for closed fracture with routine healing: Secondary | ICD-10-CM | POA: Diagnosis not present

## 2016-02-05 DIAGNOSIS — M79662 Pain in left lower leg: Secondary | ICD-10-CM | POA: Diagnosis not present

## 2016-02-08 DIAGNOSIS — S82142D Displaced bicondylar fracture of left tibia, subsequent encounter for closed fracture with routine healing: Secondary | ICD-10-CM | POA: Diagnosis not present

## 2016-02-08 DIAGNOSIS — M79662 Pain in left lower leg: Secondary | ICD-10-CM | POA: Diagnosis not present

## 2016-02-12 DIAGNOSIS — S82142D Displaced bicondylar fracture of left tibia, subsequent encounter for closed fracture with routine healing: Secondary | ICD-10-CM | POA: Diagnosis not present

## 2016-02-12 DIAGNOSIS — M79662 Pain in left lower leg: Secondary | ICD-10-CM | POA: Diagnosis not present

## 2016-02-13 DIAGNOSIS — M79672 Pain in left foot: Secondary | ICD-10-CM | POA: Diagnosis not present

## 2016-02-13 DIAGNOSIS — M76822 Posterior tibial tendinitis, left leg: Secondary | ICD-10-CM | POA: Diagnosis not present

## 2016-02-14 DIAGNOSIS — S82142D Displaced bicondylar fracture of left tibia, subsequent encounter for closed fracture with routine healing: Secondary | ICD-10-CM | POA: Diagnosis not present

## 2016-02-14 DIAGNOSIS — M79662 Pain in left lower leg: Secondary | ICD-10-CM | POA: Diagnosis not present

## 2016-02-19 DIAGNOSIS — S82142D Displaced bicondylar fracture of left tibia, subsequent encounter for closed fracture with routine healing: Secondary | ICD-10-CM | POA: Diagnosis not present

## 2016-02-19 DIAGNOSIS — M79662 Pain in left lower leg: Secondary | ICD-10-CM | POA: Diagnosis not present

## 2016-02-21 DIAGNOSIS — M79662 Pain in left lower leg: Secondary | ICD-10-CM | POA: Diagnosis not present

## 2016-02-21 DIAGNOSIS — S82142D Displaced bicondylar fracture of left tibia, subsequent encounter for closed fracture with routine healing: Secondary | ICD-10-CM | POA: Diagnosis not present

## 2016-02-26 DIAGNOSIS — S82202D Unspecified fracture of shaft of left tibia, subsequent encounter for closed fracture with routine healing: Secondary | ICD-10-CM | POA: Diagnosis not present

## 2016-02-26 DIAGNOSIS — M25462 Effusion, left knee: Secondary | ICD-10-CM | POA: Diagnosis not present

## 2016-02-26 DIAGNOSIS — S82142D Displaced bicondylar fracture of left tibia, subsequent encounter for closed fracture with routine healing: Secondary | ICD-10-CM | POA: Diagnosis not present

## 2016-03-13 ENCOUNTER — Other Ambulatory Visit: Payer: Self-pay

## 2016-03-13 MED ORDER — LANSOPRAZOLE 30 MG PO CPDR: 30.0000 mg | DELAYED_RELEASE_CAPSULE | Freq: Every day | ORAL | 4 refills | Status: DC

## 2016-07-09 ENCOUNTER — Encounter: Payer: Self-pay | Admitting: Family Medicine

## 2016-07-09 ENCOUNTER — Ambulatory Visit (INDEPENDENT_AMBULATORY_CARE_PROVIDER_SITE_OTHER): Payer: 59 | Admitting: Family Medicine

## 2016-07-09 VITALS — BP 132/74 | HR 60 | Resp 12 | Ht 63.25 in | Wt 178.0 lb

## 2016-07-09 DIAGNOSIS — E7849 Other hyperlipidemia: Secondary | ICD-10-CM

## 2016-07-09 DIAGNOSIS — E784 Other hyperlipidemia: Secondary | ICD-10-CM

## 2016-07-09 DIAGNOSIS — N528 Other male erectile dysfunction: Secondary | ICD-10-CM | POA: Diagnosis not present

## 2016-07-09 DIAGNOSIS — Z125 Encounter for screening for malignant neoplasm of prostate: Secondary | ICD-10-CM | POA: Diagnosis not present

## 2016-07-09 DIAGNOSIS — Z0000 Encounter for general adult medical examination without abnormal findings: Secondary | ICD-10-CM

## 2016-07-09 DIAGNOSIS — Z1211 Encounter for screening for malignant neoplasm of colon: Secondary | ICD-10-CM | POA: Diagnosis not present

## 2016-07-09 DIAGNOSIS — K219 Gastro-esophageal reflux disease without esophagitis: Secondary | ICD-10-CM

## 2016-07-09 DIAGNOSIS — S2223XD Sternal manubrial dissociation, subsequent encounter for fracture with routine healing: Secondary | ICD-10-CM | POA: Diagnosis not present

## 2016-07-09 LAB — POCT URINALYSIS DIPSTICK
Bilirubin, UA: NEGATIVE
Glucose, UA: NEGATIVE
KETONES UA: NEGATIVE
LEUKOCYTES UA: NEGATIVE
NITRITE UA: NEGATIVE
PH UA: 6 (ref 5.0–8.0)
PROTEIN UA: NEGATIVE
Spec Grav, UA: 1.015 (ref 1.010–1.025)
Urobilinogen, UA: 0.2 E.U./dL

## 2016-07-09 LAB — IFOBT (OCCULT BLOOD): IFOBT: NEGATIVE

## 2016-07-09 MED ORDER — SILDENAFIL CITRATE 20 MG PO TABS: ORAL_TABLET | ORAL | 5 refills | Status: DC
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
## 2016-07-11 DIAGNOSIS — Z125 Encounter for screening for malignant neoplasm of prostate: Secondary | ICD-10-CM | POA: Diagnosis not present

## 2016-07-11 DIAGNOSIS — Z0000 Encounter for general adult medical examination without abnormal findings: Secondary | ICD-10-CM | POA: Diagnosis not present

## 2016-07-12 LAB — COMPREHENSIVE METABOLIC PANEL
ALK PHOS: 66 IU/L (ref 39–117)
ALT: 29 IU/L (ref 0–44)
AST: 30 IU/L (ref 0–40)
Albumin/Globulin Ratio: 1.8 (ref 1.2–2.2)
Albumin: 4.4 g/dL (ref 3.6–4.8)
BILIRUBIN TOTAL: 1.1 mg/dL (ref 0.0–1.2)
BUN/Creatinine Ratio: 17 (ref 10–24)
BUN: 20 mg/dL (ref 8–27)
CHLORIDE: 104 mmol/L (ref 96–106)
CO2: 24 mmol/L (ref 20–29)
CREATININE: 1.19 mg/dL (ref 0.76–1.27)
Calcium: 9.2 mg/dL (ref 8.6–10.2)
GFR calc Af Amer: 75 mL/min/{1.73_m2} (ref >59)
GFR calc non Af Amer: 65 mL/min/{1.73_m2} (ref >59)
GLOBULIN, TOTAL: 2.4 g/dL (ref 1.5–4.5)
GLUCOSE: 90 mg/dL (ref 65–99)
Potassium: 4.7 mmol/L (ref 3.5–5.2)
SODIUM: 140 mmol/L (ref 134–144)
Total Protein: 6.8 g/dL (ref 6.0–8.5)

## 2016-07-12 LAB — CBC WITH DIFFERENTIAL/PLATELET
BASOS ABS: 0 10*3/uL (ref 0.0–0.2)
Basos: 1 %
EOS (ABSOLUTE): 0.2 10*3/uL (ref 0.0–0.4)
Eos: 6 %
Hematocrit: 43.6 % (ref 37.5–51.0)
Hemoglobin: 15.2 g/dL (ref 13.0–17.7)
Immature Grans (Abs): 0 10*3/uL (ref 0.0–0.1)
Immature Granulocytes: 0 %
LYMPHS ABS: 1 10*3/uL (ref 0.7–3.1)
Lymphs: 27 %
MCH: 30.6 pg (ref 26.6–33.0)
MCHC: 34.9 g/dL (ref 31.5–35.7)
MCV: 88 fL (ref 79–97)
MONOS ABS: 0.4 10*3/uL (ref 0.1–0.9)
Monocytes: 10 %
Neutrophils Absolute: 2.2 10*3/uL (ref 1.4–7.0)
Neutrophils: 56 %
Platelets: 210 10*3/uL (ref 150–379)
RBC: 4.96 x10E6/uL (ref 4.14–5.80)
RDW: 13.6 % (ref 12.3–15.4)
WBC: 3.8 10*3/uL (ref 3.4–10.8)

## 2016-07-12 LAB — LIPID PANEL WITH LDL/HDL RATIO
CHOLESTEROL TOTAL: 161 mg/dL (ref 100–199)
HDL: 39 mg/dL — ABNORMAL LOW (ref >39)
LDL CALC: 103 mg/dL — AB (ref 0–99)
LDl/HDL Ratio: 2.6 ratio (ref 0.0–3.6)
TRIGLYCERIDES: 94 mg/dL (ref 0–149)
VLDL Cholesterol Cal: 19 mg/dL (ref 5–40)

## 2016-07-12 LAB — PSA: Prostate Specific Ag, Serum: 3.3 ng/mL (ref 0.0–4.0)

## 2016-07-12 LAB — TSH: TSH: 2.04 u[IU]/mL (ref 0.450–4.500)

## 2016-10-17 DIAGNOSIS — Z23 Encounter for immunization: Secondary | ICD-10-CM | POA: Diagnosis not present

## 2017-05-26 ENCOUNTER — Other Ambulatory Visit: Payer: Self-pay | Admitting: Family Medicine

## 2017-07-20 ENCOUNTER — Ambulatory Visit (INDEPENDENT_AMBULATORY_CARE_PROVIDER_SITE_OTHER): Payer: 59 | Admitting: Family Medicine

## 2017-07-20 DIAGNOSIS — Z125 Encounter for screening for malignant neoplasm of prostate: Secondary | ICD-10-CM | POA: Diagnosis not present

## 2017-07-20 DIAGNOSIS — Z0000 Encounter for general adult medical examination without abnormal findings: Secondary | ICD-10-CM
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
## 2017-07-24 DIAGNOSIS — Z0000 Encounter for general adult medical examination without abnormal findings: Secondary | ICD-10-CM | POA: Diagnosis not present

## 2017-07-24 DIAGNOSIS — Z125 Encounter for screening for malignant neoplasm of prostate: Secondary | ICD-10-CM | POA: Diagnosis not present

## 2017-07-25 LAB — COMPREHENSIVE METABOLIC PANEL
A/G RATIO: 1.7 (ref 1.2–2.2)
ALBUMIN: 4.2 g/dL (ref 3.6–4.8)
ALT: 31 IU/L (ref 0–44)
AST: 30 IU/L (ref 0–40)
Alkaline Phosphatase: 59 IU/L (ref 39–117)
BUN/Creatinine Ratio: 15 (ref 10–24)
BUN: 19 mg/dL (ref 8–27)
Bilirubin Total: 0.8 mg/dL (ref 0.0–1.2)
CALCIUM: 9.2 mg/dL (ref 8.6–10.2)
CO2: 24 mmol/L (ref 20–29)
Chloride: 105 mmol/L (ref 96–106)
Creatinine, Ser: 1.27 mg/dL (ref 0.76–1.27)
GFR, EST AFRICAN AMERICAN: 69 mL/min/{1.73_m2} (ref >59)
GFR, EST NON AFRICAN AMERICAN: 60 mL/min/{1.73_m2} (ref >59)
GLOBULIN, TOTAL: 2.5 g/dL (ref 1.5–4.5)
Glucose: 91 mg/dL (ref 65–99)
POTASSIUM: 4.9 mmol/L (ref 3.5–5.2)
Sodium: 142 mmol/L (ref 134–144)
TOTAL PROTEIN: 6.7 g/dL (ref 6.0–8.5)

## 2017-07-25 LAB — CBC WITH DIFFERENTIAL/PLATELET
BASOS: 1 %
Basophils Absolute: 0 10*3/uL (ref 0.0–0.2)
EOS (ABSOLUTE): 0.2 10*3/uL (ref 0.0–0.4)
EOS: 5 %
HEMATOCRIT: 42.7 % (ref 37.5–51.0)
HEMOGLOBIN: 14.7 g/dL (ref 13.0–17.7)
IMMATURE GRANS (ABS): 0 10*3/uL (ref 0.0–0.1)
IMMATURE GRANULOCYTES: 0 %
LYMPHS: 28 %
Lymphocytes Absolute: 1.1 10*3/uL (ref 0.7–3.1)
MCH: 31.1 pg (ref 26.6–33.0)
MCHC: 34.4 g/dL (ref 31.5–35.7)
MCV: 90 fL (ref 79–97)
MONOCYTES: 8 %
Monocytes Absolute: 0.3 10*3/uL (ref 0.1–0.9)
NEUTROS PCT: 58 %
Neutrophils Absolute: 2.4 10*3/uL (ref 1.4–7.0)
PLATELETS: 200 10*3/uL (ref 150–450)
RBC: 4.73 x10E6/uL (ref 4.14–5.80)
RDW: 13.3 % (ref 12.3–15.4)
WBC: 4.1 10*3/uL (ref 3.4–10.8)

## 2017-07-25 LAB — TSH: TSH: 1.98 u[IU]/mL (ref 0.450–4.500)

## 2017-07-25 LAB — LIPID PANEL WITH LDL/HDL RATIO
Cholesterol, Total: 157 mg/dL (ref 100–199)
HDL: 37 mg/dL — AB (ref >39)
LDL CALC: 100 mg/dL — AB (ref 0–99)
LDL/HDL RATIO: 2.7 ratio (ref 0.0–3.6)
Triglycerides: 100 mg/dL (ref 0–149)
VLDL Cholesterol Cal: 20 mg/dL (ref 5–40)

## 2017-07-25 LAB — PSA: PROSTATE SPECIFIC AG, SERUM: 3.5 ng/mL (ref 0.0–4.0)

## 2017-07-29 ENCOUNTER — Telehealth: Payer: Self-pay

## 2017-07-29 NOTE — Telephone Encounter (Signed)
-----   Message from Jerrol Banana., MD sent at 07/29/2017  1:42 PM EDT ----- Labs OK

## 2017-07-29 NOTE — Telephone Encounter (Signed)
LMTCB 07/29/2017   Thanks,   -Jarrett Albor 

## 2017-07-31 NOTE — Telephone Encounter (Signed)
Pt advised.   Thanks,   -Jordanne Elsbury  

## 2017-10-23 DIAGNOSIS — Z23 Encounter for immunization: Secondary | ICD-10-CM | POA: Diagnosis not present

## 2017-10-26 DIAGNOSIS — D225 Melanocytic nevi of trunk: Secondary | ICD-10-CM | POA: Diagnosis not present

## 2017-10-26 DIAGNOSIS — Z1283 Encounter for screening for malignant neoplasm of skin: Secondary | ICD-10-CM | POA: Diagnosis not present

## 2017-10-26 DIAGNOSIS — L820 Inflamed seborrheic keratosis: Secondary | ICD-10-CM | POA: Diagnosis not present

## 2017-10-26 DIAGNOSIS — L821 Other seborrheic keratosis: Secondary | ICD-10-CM | POA: Diagnosis not present
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
## 2018-07-22 ENCOUNTER — Encounter: Payer: Self-pay | Admitting: Family Medicine

## 2018-08-05 ENCOUNTER — Encounter: Payer: Self-pay | Admitting: Family Medicine

## 2018-08-05 ENCOUNTER — Ambulatory Visit (INDEPENDENT_AMBULATORY_CARE_PROVIDER_SITE_OTHER): Payer: 59 | Admitting: Family Medicine

## 2018-08-05 ENCOUNTER — Other Ambulatory Visit: Payer: Self-pay

## 2018-08-05 VITALS — BP 126/73 | HR 72 | Temp 98.6°F | Resp 16 | Ht 63.0 in | Wt 178.0 lb

## 2018-08-05 DIAGNOSIS — Z0000 Encounter for general adult medical examination without abnormal findings: Secondary | ICD-10-CM

## 2018-08-05 DIAGNOSIS — N529 Male erectile dysfunction, unspecified: Secondary | ICD-10-CM

## 2018-08-05 DIAGNOSIS — M542 Cervicalgia: Secondary | ICD-10-CM

## 2018-08-05 DIAGNOSIS — Z125 Encounter for screening for malignant neoplasm of prostate: Secondary | ICD-10-CM

## 2018-08-05 DIAGNOSIS — R42 Dizziness and giddiness: Secondary | ICD-10-CM

## 2018-08-05 MED ORDER — TADALAFIL 20 MG PO TABS: ORAL_TABLET | ORAL | 5 refills | Status: DC
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
## 2018-08-07 LAB — COMPREHENSIVE METABOLIC PANEL
ALT: 29 IU/L (ref 0–44)
AST: 30 IU/L (ref 0–40)
Albumin/Globulin Ratio: 1.8 (ref 1.2–2.2)
Albumin: 4.1 g/dL (ref 3.8–4.8)
Alkaline Phosphatase: 56 IU/L (ref 39–117)
BUN/Creatinine Ratio: 12 (ref 10–24)
BUN: 14 mg/dL (ref 8–27)
Bilirubin Total: 0.9 mg/dL (ref 0.0–1.2)
CO2: 22 mmol/L (ref 20–29)
Calcium: 9.1 mg/dL (ref 8.6–10.2)
Chloride: 102 mmol/L (ref 96–106)
Creatinine, Ser: 1.13 mg/dL (ref 0.76–1.27)
GFR calc Af Amer: 79 mL/min/{1.73_m2} (ref >59)
GFR calc non Af Amer: 68 mL/min/{1.73_m2} (ref >59)
Globulin, Total: 2.3 g/dL (ref 1.5–4.5)
Glucose: 88 mg/dL (ref 65–99)
Potassium: 4.6 mmol/L (ref 3.5–5.2)
Sodium: 139 mmol/L (ref 134–144)
Total Protein: 6.4 g/dL (ref 6.0–8.5)

## 2018-08-07 LAB — CBC WITH DIFFERENTIAL/PLATELET
Basophils Absolute: 0 10*3/uL (ref 0.0–0.2)
Basos: 1 %
EOS (ABSOLUTE): 0.1 10*3/uL (ref 0.0–0.4)
Eos: 4 %
Hematocrit: 44.9 % (ref 37.5–51.0)
Hemoglobin: 15.3 g/dL (ref 13.0–17.7)
Immature Grans (Abs): 0 10*3/uL (ref 0.0–0.1)
Immature Granulocytes: 0 %
Lymphocytes Absolute: 1.1 10*3/uL (ref 0.7–3.1)
Lymphs: 29 %
MCH: 30.6 pg (ref 26.6–33.0)
MCHC: 34.1 g/dL (ref 31.5–35.7)
MCV: 90 fL (ref 79–97)
Monocytes Absolute: 0.4 10*3/uL (ref 0.1–0.9)
Monocytes: 10 %
Neutrophils Absolute: 2.2 10*3/uL (ref 1.4–7.0)
Neutrophils: 56 %
Platelets: 194 10*3/uL (ref 150–450)
RBC: 5 x10E6/uL (ref 4.14–5.80)
RDW: 12.7 % (ref 11.6–15.4)
WBC: 3.9 10*3/uL (ref 3.4–10.8)

## 2018-08-07 LAB — LIPID PANEL
Chol/HDL Ratio: 4.2 ratio (ref 0.0–5.0)
Cholesterol, Total: 154 mg/dL (ref 100–199)
HDL: 37 mg/dL — ABNORMAL LOW (ref >39)
LDL Calculated: 95 mg/dL (ref 0–99)
Triglycerides: 110 mg/dL (ref 0–149)
VLDL Cholesterol Cal: 22 mg/dL (ref 5–40)

## 2018-08-07 LAB — TSH: TSH: 2.14 u[IU]/mL (ref 0.450–4.500)

## 2018-08-07 LAB — PSA: Prostate Specific Ag, Serum: 4 ng/mL (ref 0.0–4.0)

## 2018-08-10 ENCOUNTER — Telehealth: Payer: Self-pay

## 2018-08-10 NOTE — Telephone Encounter (Signed)
Left message for patient to call regarding lab results.  

## 2018-08-10 NOTE — Telephone Encounter (Signed)
-----   Message from Jerrol Banana., MD sent at 08/09/2018  7:14 PM EDT ----- Labs ok--PSA borderline--consider seeing urology or f/u end 6 months with recheck and PSA.

## 2018-08-19 ENCOUNTER — Other Ambulatory Visit: Payer: Self-pay | Admitting: Family Medicine

## 2018-08-19 ENCOUNTER — Telehealth: Payer: Self-pay

## 2018-08-19 DIAGNOSIS — R972 Elevated prostate specific antigen [PSA]: Secondary | ICD-10-CM

## 2018-08-19 NOTE — Telephone Encounter (Signed)
ok 

## 2018-08-19 NOTE — Telephone Encounter (Signed)
Patient notified of lab results. He would like a referral to Dr. Bernardo Heater.

## 2018-09-30 ENCOUNTER — Ambulatory Visit (INDEPENDENT_AMBULATORY_CARE_PROVIDER_SITE_OTHER): Payer: 59 | Admitting: Urology

## 2018-09-30 ENCOUNTER — Encounter: Payer: Self-pay | Admitting: Urology

## 2018-09-30 ENCOUNTER — Other Ambulatory Visit: Payer: Self-pay

## 2018-09-30 VITALS — BP 157/74 | HR 57 | Ht 67.0 in | Wt 172.0 lb

## 2018-09-30 DIAGNOSIS — R972 Elevated prostate specific antigen [PSA]: Secondary | ICD-10-CM

## 2018-09-30 NOTE — Progress Notes (Signed)
09/30/18 10:29 AM   Cody Rush Aug 08, 1955 [id number]  Referring provider: Jerrol Banana., MD [address] [place],  [place] [address]  CC: PSA screening  HPI: I saw Cody Rush in urology clinic in consultation for mildly elevated PSA from Dr. Rosanna Randy.  He is a very healthy 63 year old male with past medical history notable for erectile dysfunction moderately improved with use of tadalafil who was recently found to have a PSA value of 4.0.  This is slightly increased from 3.5 last year.  He has a prior history of an elevated PSA to 4.63 in 2011, and this dropped back down to normal on repeat.  He has never undergone prostate biopsy previously.  He does ride a bike every other day for exercise.  He reports a family history of prostate cancer in his father, however this was nonlethal and he passed away from heart disease.  He has a family history of breast cancer in his mother.  He denies any prior abdominal surgeries.  He denies any urinary symptoms of weak stream, dysuria, urgency, frequency, or incontinence.  There are no aggravating or alleviating factors.  Severity is mild.   PMH: Erectile dysfunction  Surgical History: Past Surgical History:  Procedure Laterality Date  . KNEE SURGERY    . LITHOTRIPSY    . THORACOSCOPY  11/2015   Allergies: No Known Allergies  Family History: Family History  Problem Relation Age of Onset  . Breast cancer Mother   . Colon cancer Father   . Stroke Father   . Heart disease Father     Social History:  reports that he has never smoked. He has quit using smokeless tobacco.  His smokeless tobacco use included chew. He reports that he does not drink alcohol or use drugs.  ROS: Please see flowsheet from today's date for complete review of systems.  Physical Exam: BP (!) 157/74   Pulse (!) 57   Ht 5\' 7"  (1.702 m)   Wt 172 lb (78 kg)   BMI 26.94 kg/m    Constitutional:  Alert and oriented, No acute distress.  Cardiovascular: No clubbing, cyanosis, or edema. Respiratory: Normal respiratory effort, no increased work of breathing. GI: Abdomen is soft, nontender, nondistended, no abdominal masses GU: No CVA tenderness DRE: 40 g, prominent sulcus, no nodules or masses Lymph: No cervical or inguinal lymphadenopathy. Skin: No rashes, bruises or suspicious lesions. Neurologic: Grossly intact, no focal deficits, moving all 4 extremities. Psychiatric: Normal mood and affect.  Laboratory Data:  PSA history 07/2018: 4.0 07/2017: 3.5 06/2016: 3.3 04/2015: 2.8 05/2014: 2.8 11/2009: 4.63 02/2008: 1.4  Pertinent Imaging: None to review  Assessment & Plan:   In summary, the patient is a healthy 63 year old male with a PSA of 4, benign DRE, and family history of nonlethal prostate cancer who presents for further evaluation.  We reviewed the implications of an elevated PSA and the uncertainty surrounding it. In general, a man's PSA increases with age and is produced by both normal and cancerous prostate tissue. The differential diagnosis for elevated PSA includes BPH, prostate cancer, infection, recent intercourse/ejaculation, recent urethroscopic manipulation (foley placement/cystoscopy) or trauma, and prostatitis.   Management of an elevated PSA can include observation or prostate biopsy and we discussed this in detail. Our goal is to detect clinically significant prostate cancers, and manage with either active surveillance, surgery, or radiation for localized disease. Risks of prostate biopsy include bleeding, infection (including life threatening sepsis), pain, and lower urinary symptoms. Hematuria,  hematospermia, and blood in the stool are all common after biopsy and can persist up to 4 weeks.   Repeat PSA today with free/total ratio Pursue biopsy if PSA remains elevated  Billey Co, MD  [facility] [address], [place], [place] [address] [phone]

## 2018-09-30 NOTE — Patient Instructions (Signed)
Prostate Cancer Screening  The prostate is a walnut-sized gland that is located below the bladder and in front of the rectum in males. The function of the prostate (prostate gland) is to add fluid to semen during ejaculation. Prostate cancer is the second most common type of cancer in men. A screening test for cancer is a test that is done before cancer symptoms start. Screening can help to identify cancer at an early stage, when the cancer can be treated more easily. The recommended prostate cancer screening test is a blood test called the prostate-specific antigen (PSA) test. PSA is a protein that is made in the prostate. As you age, your prostate naturally produces more PSA. Abnormally high PSA levels may be caused by:  Prostate cancer.  An enlarged prostate that is not caused by cancer (benign prostatic hyperplasia, BPH). This condition is very common in older men.  A prostate gland infection (prostatitis).  Medicines to assist with hair growth, such as finasteride. Depending on the PSA results, you may need more tests, such as:  A physical exam to check the size of your prostate gland.  Blood and imaging tests.  A procedure to remove tissue samples from your prostate gland for testing (biopsy). Who should have screening? Screening recommendations vary based on age.  If you are younger than age 40, screening is not recommended.  If you are age 40-54 and you have no risk factors, screening is not recommended.  If you are younger than age 55, ask your health care provider if you need screening if you have one of these risk factors: ? Being of African-American descent. ? Having a family history of prostate cancer.  If you are age 55-69, talk with your health care provider about your need for screening and how often screening should be done.  If you are older than age 70, screening is not recommended. This is because the risks that screening can cause are greater than the benefits  that it may provide (risks outweigh the benefits). If you are at high risk for prostate cancer, your health care provider may recommend that you have screenings more often or start screening at a younger age. You may be at high risk if you:  Are older than age 55.  Are African-American.  Have a father, brother, or uncle who has been diagnosed with prostate cancer. The risk may be higher if your family member's cancer occurred at an early age. What are the benefits of screening? There is a small chance that screening may lower your risk of dying from prostate cancer. The chance is small because prostate cancer is typically a slow-growing cancer, and most men with prostate cancer die from a different cause. What are the risks of screening? The main risk of prostate cancer screening is diagnosing and treating prostate cancer that would never have caused any symptoms or problems (overdiagnosis and overtreatment). PSA screening cannot tell you if your PSA is high due to cancer or a different cause. A prostate biopsy is the only procedure to diagnose prostate cancer. Even the results of a biopsy may not tell you if your cancer needs to be treated. Slow-growing prostate cancer may not need any treatment other than monitoring, so diagnosing and treating it may cause unnecessary stress or other side effects. A prostate biopsy may also cause:  Infection or fever.  A false negative. This is a result that shows that you do not have prostate cancer when you actually do have prostate cancer. Questions   to ask your health care provider  When should I start prostate cancer screening?  What is my risk for prostate cancer?  How often do I need screening?  What type of screening tests do I need?  How do I get my test results?  What do my results mean?  Do I need treatment? Contact a health care provider if:  You have difficulty urinating.  You have pain when you urinate or ejaculate.  You have  blood in your urine or semen.  You have pain in your back or in the area of your prostate.  You have trouble getting or maintaining an erection (erectile dysfunction, ED). Summary  Prostate cancer is a common type of cancer in men. The prostate (prostate gland) is located below the bladder and in front of the rectum. This gland adds fluid to semen during ejaculation.  Prostate cancer screening may identify cancer at an early stage, when the cancer can be treated more easily.  The prostate-specific antigen (PSA) test is the recommended screening test for prostate cancer.  Discuss the risks and benefits of prostate cancer screening with your health care provider. If you are age 14 or older, screening is likely to lead to more risks than benefits (risks outweigh the benefits). This information is not intended to replace advice given to you by your health care provider. Make sure you discuss any questions you have with your health care provider. Document Released: 10/10/2016 Document Revised: 12/12/2016 Document Reviewed: 10/10/2016 Elsevier Patient Education  2020 [employer].  Prostate Biopsy Instructions  Stop all aspirin or blood thinners (aspirin, plavix, coumadin, warfarin, motrin, ibuprofen, advil, aleve, naproxen, naprosyn) for 7 days prior to the procedure.  If you have any questions about stopping these medications, please contact your primary care physician or cardiologist.  Having a light meal prior to the procedure is recommended.  If you are diabetic or have low blood sugar please bring a small snack or glucose tablet.  A Fleets enema is needed to be purchased over the counter at a local pharmacy and used 2 hours before you scheduled appointment.  This can be purchased over the counter at any pharmacy.  Antibiotics will be administered in the clinic at the time of the procedure unless otherwise specified.    Please bring someone with you to the procedure to drive you home.   A follow up appointment has been scheduled for you to receive the results of the biopsy.  If you have any questions or concerns, please feel free to call the office at [phone] or send a Mychart message.    Thank you, Staff at [facility]

## 2018-10-01 ENCOUNTER — Telehealth: Payer: Self-pay | Admitting: *Deleted

## 2018-10-01 LAB — PSA, TOTAL AND FREE
PSA, Free Pct: 20 %
PSA, Free: 0.92 ng/mL
Prostate Specific Ag, Serum: 4.6 ng/mL — ABNORMAL HIGH (ref 0.0–4.0)

## 2018-10-01 NOTE — Telephone Encounter (Addendum)
Left patient a VM to return call-1st attempt  ----- Message from Billey Co, MD sent at 10/01/2018  9:13 AM EDT ----- PSA remains elevated at 4.6 from 4. I would recommend prostate biopsy as we discussed in clinic. Please schedule and review instructions.  If he does not want prostate biopsy can repeat PSA in 6 weeks, but I would recommend proceeding with prostate biopsy now with his 2 elevated PSAs and family history.  Thanks Nickolas Madrid, MD 10/01/2018

## 2018-10-01 NOTE — Telephone Encounter (Signed)
Patient notified and he is wanting to proceed with the Biopsy and instructions were given. Patient voiced understanding. Appointment has been scheduled.

## 2018-10-28 ENCOUNTER — Other Ambulatory Visit: Payer: 59 | Admitting: Urology

## 2018-11-18 ENCOUNTER — Other Ambulatory Visit: Payer: Self-pay | Admitting: Urology

## 2018-11-18 ENCOUNTER — Encounter: Payer: Self-pay | Admitting: Urology

## 2018-11-18 ENCOUNTER — Other Ambulatory Visit: Payer: Self-pay

## 2018-11-18 ENCOUNTER — Ambulatory Visit: Payer: 59 | Admitting: Urology

## 2018-11-18 ENCOUNTER — Ambulatory Visit (INDEPENDENT_AMBULATORY_CARE_PROVIDER_SITE_OTHER): Payer: 59 | Admitting: Urology

## 2018-11-18 VITALS — BP 151/90 | HR 92 | Ht 66.0 in | Wt 173.0 lb

## 2018-11-18 DIAGNOSIS — R972 Elevated prostate specific antigen [PSA]: Secondary | ICD-10-CM | POA: Diagnosis not present

## 2018-11-18 MED ORDER — LEVOFLOXACIN 500 MG PO TABS
500.0000 mg | ORAL_TABLET | Freq: Once | ORAL | Status: AC
  Administered 2018-11-18: 500 mg via ORAL

## 2018-11-18 MED ORDER — GENTAMICIN SULFATE 40 MG/ML IJ SOLN
40.0000 mg | Freq: Once | INTRAMUSCULAR | Status: AC
  Administered 2018-11-18: 40 mg via INTRAMUSCULAR

## 2018-11-18 NOTE — Progress Notes (Signed)
   11/18/18  Indication: Elevated PSA, 4.6  Prostate Biopsy Procedure   Informed consent was obtained, and we discussed the risks of bleeding and infection/sepsis. A time out was performed to ensure correct patient identity.  Pre-Procedure: - PSA 4.6, 20% free - Gentamicin and levaquin given for antibiotic prophylaxis - Transrectal Ultrasound performed revealing a 52 gm prostate, PSA density 0.09 - No significant hypoechoic or median lobe noted  Procedure: - Prostate block performed using 10 cc 1% lidocaine and biopsies taken from sextant areas, a total of 12 under ultrasound guidance.  Post-Procedure: - Patient tolerated the procedure well - He was counseled to seek immediate medical attention if experiences significant bleeding, fevers, or severe pain - Return in one week to discuss biopsy results  Assessment/ Plan: Will follow up in 1-2 weeks to discuss pathology  Nickolas Madrid, MD 11/18/2018

## 2018-11-29 ENCOUNTER — Ambulatory Visit: Payer: 59 | Admitting: Urology

## 2018-11-29 ENCOUNTER — Other Ambulatory Visit: Payer: Self-pay

## 2018-11-29 ENCOUNTER — Other Ambulatory Visit: Payer: Self-pay | Admitting: Urology

## 2018-11-29 ENCOUNTER — Encounter: Payer: Self-pay | Admitting: Urology

## 2018-11-29 ENCOUNTER — Ambulatory Visit (INDEPENDENT_AMBULATORY_CARE_PROVIDER_SITE_OTHER): Payer: 59 | Admitting: Urology

## 2018-11-29 VITALS — BP 168/87 | HR 76 | Ht 67.0 in | Wt 172.0 lb

## 2018-11-29 DIAGNOSIS — C61 Malignant neoplasm of prostate: Secondary | ICD-10-CM

## 2018-11-29 DIAGNOSIS — R972 Elevated prostate specific antigen [PSA]: Secondary | ICD-10-CM | POA: Diagnosis not present

## 2018-11-29 LAB — ANATOMIC PATHOLOGY REPORT: PDF Image: 0

## 2018-11-29 NOTE — Progress Notes (Signed)
11/29/2018 1:52 PM   Cody Rush 1955-05-15 [id number]  Reason for visit: Discuss prostate biopsy results  HPI: I saw Cody Rush in urology clinic to discuss his prostate biopsy results.  He is a healthy 63 year old male who underwent a prostate biopsy for an elevated PSA of 4.6 with 20% free on 11/18/2018.  This showed a 52 g prostate with a PSA density of 0.09.  Biopsy showed 2/12 cores positive for grade group 1 gleason score 3+3=6 prostate cancer, with 7% max core involvement.  This places him in the very low risk category per the AUA guidelines.  We had a lengthy conversation today about the patient's new diagnosis of prostate cancer.  We reviewed the risk classifications per the AUA guidelines including very low risk, low risk, intermediate risk, and high risk disease, and the need for additional staging imaging with CT and bone scan in patients with unfavorable intermediate risk and high risk disease.  I explained that his life expectancy, clinical stage, Gleason score, PSA, and other Rush-morbidities influence treatment strategies.  We discussed the roles of active surveillance, radiation therapy, surgical therapy with robotic prostatectomy, and hormone therapy with androgen deprivation.  We discussed that patients urinary symptoms also impact treatment strategy, as patients with severe lower urinary tract symptoms may have significant worsening or even develop urinary retention after undergoing radiation.  In regards to surgery, we discussed robotic prostatectomy +/- lymphadenectomy at length.  The procedure takes 3 to 4 hours, and patient's typically discharge home on post-op day #1.  A Foley catheter is left in place for 7 to 10 days to allow for healing of the vesicourethral anastomosis.  There is a small risk of bleeding, infection, damage to surrounding structures or bowel, hernia, DVT/PE, or serious cardiac or pulmonary complications.  We discussed at length post-op side effects  including erectile dysfunction, and the importance of pre-operative erectile function on long-term outcomes.  Even with a nerve sparing approach, there is an approximately 25% rate of permanent erectile dysfunction.  We also discussed postop urinary incontinence at length.  We expect patients to have stress incontinence post-operatively that will improve over period of weeks to months.  Less than 10% of men will require a pad at 1 year after surgery.  Patients will need to avoid heavy lifting and strenuous activity for 3 to 4 weeks, but most men return to their baseline activity status by 6 weeks.  We primarily focused on active surveillance as the first-line treatment for patients with very low risk and low risk prostate cancer.  We discussed the importance of a confirmatory biopsy within 9 to 12 months, as well as the role of prostate MRI and active surveillance.  We discussed the need for long-term surveillance with PSA/DRE monitoring every 6 to 12 months.  We also discussed that approximately 25% of patients will come off of active surveillance and pursue more definitive treatment based on either new biopsy results, MRI findings, rising PSA, or patient anxiety.  We discussed the extremely low, but not zero, risk of developing metastatic prostate cancer while on active surveillance.  In summary, Cody Rush is a 63 y.o. man with newly diagnosed very low risk prostate cancer. He would like to pursue active surveillance.  RTC 9 months for confirmatory biopsy, PSA prior  A total of 25 minutes were spent face-to-face with the patient, greater than 50% was spent in patient education, counseling, and coordination of care regarding new diagnosis of very low risk prostate cancer.  Cody Rush, [facility] [address], [place],  [address] [phone]

## 2018-11-29 NOTE — Patient Instructions (Addendum)
You have very low risk prostate cancer that can be safely watched with active surveillance. We will repeat a prostate biopsy in 9 months, and continue to check the PSA every 6-78mo.  Prostate Cancer  The prostate is a walnut-sized gland that is involved in the production of semen. It is located below a man's bladder, in front of the rectum. Prostate cancer is the abnormal growth of cells in the prostate gland. What are the causes? The exact cause of this condition is not known. What increases the risk? This condition is more likely to develop in men who:  Are older than age 67.  Are African-American.  Are obese.  Have a family history of prostate cancer.  Have a family history of breast cancer. What are the signs or symptoms? Symptoms of this condition include:  A need to urinate often.  Weak or interrupted flow of urine.  Trouble starting or stopping urination.  Inability to urinate.  Pain or burning during urination.  Painful ejaculation.  Blood in urine or semen.  Persistent pain or discomfort in the lower back, lower abdomen, hips, or upper thighs.  Trouble getting an erection.  Trouble emptying the bladder all the way. How is this diagnosed? This condition can be diagnosed with:  A digital rectal exam. For this exam, a health care provider inserts a gloved finger into the rectum to feel the prostate gland.  A blood test called a prostate-specific antigen (PSA) test.  An imaging test called transrectal ultrasonography.  A procedure in which a sample of tissue is taken from the prostate and examined under a microscope (prostate biopsy). Once the condition is diagnosed, tests will be done to determine how far the cancer has spread. This is called staging the cancer. Staging may involve imaging tests, such as:  A bone scan.  A CT scan.  A PET scan.  An MRI. The stages of prostate cancer are as follows:  Stage I. At this stage, the cancer is found in the  prostate only. The cancer is not visible on imaging tests and it is usually found by accident, such as during a prostate surgery.  Stage II. At this stage, the cancer is more advanced than it is in stage I, but the cancer has not spread outside the prostate.  Stage III. At this stage, the cancer has spread beyond the outer layer of the prostate to nearby tissues. The cancer may be found in the seminal vesicles, which are near the bladder and the prostate.  Stage IV. At this stage, the cancer has spread other parts of the body, such as the lymph nodes, bones, bladder, rectum, liver, or lungs. How is this treated? Treatment for this condition depends on several factors, including the stage of the cancer, your age, personal preferences, and your overall health. Talk with your health care provider about treatment options that are recommended for you. Common treatments include:  Observation for early stage prostate cancer (active surveillance). This involves having exams, blood tests, and in some cases, more biopsies. For some men, this is the only treatment needed.  Surgery. Types of surgeries include: ? Open surgery. In this surgery, a larger incision is made to remove the prostate. ? A laparoscopic prostatectomy. This is a surgery to remove the prostate and lymph nodes through several, small incisions. It is often referred to as a minimally invasive surgery. ? A robotic prostatectomy. This is a surgery to remove the prostate and lymph nodes with the help of a robotic  arm that is controlled by a computer. ? Orchiectomy. This is a surgery to remove the testicles. ? Cryosurgery. This is a surgery to freeze and destroy cancer cells.  Radiation treatment. Types of radiation treatment include: ? External beam radiation. This type aims beams of radiation from outside the body at the prostate to destroy cancerous cells. ? Brachytherapy. This type uses radioactive needles, seeds, wires, or tubes that are  implanted into the prostate gland. Like external beam radiation, brachytherapy destroys cancerous cells. An advantage is that this type of radiation limits the damage to surrounding tissue and has fewer side effects.  High-intensity, focused ultrasonography. This treatment destroys cancer cells by delivering high-energy ultrasound waves to the cancerous cells.  Chemotherapy medicines. This treatment kills cancer cells or stops them from multiplying.  Hormone treatment. This treatment involves taking medicines that act on one of the male hormones (testosterone): ? By stopping your body from producing testosterone. ? By blocking testosterone from reaching cancer cells. Follow these instructions at home:  Take over-the-counter and prescription medicines only as told by your health care provider.  Maintain a healthy diet.  Get plenty of sleep.  Consider joining a support group for men who have prostate cancer. Meeting with a support group may help you learn to cope with the stress of having cancer.  Keep all follow-up visits as told by your health care provider. This is important.  If you have to go to the hospital, notify your cancer specialist (oncologist).  Treatment for prostate cancer may affect sexual function. Continue to have intimate moments with your partner. This may include touching, holding, hugging, and caressing. Contact a health care provider if:  You have trouble urinating.  You have blood in your urine.  You have pain in your hips, back, or chest. Get help right away if:  You have weakness or numbness in your legs.  You cannot control urination or your bowel movements (incontinence).  You have trouble breathing.  You have sudden chest pain.  You have chills or a fever. Summary  The prostate is a walnut-sized gland that is involved in the production of semen. It is located below a man's bladder, in front of the rectum. Prostate cancer is the abnormal growth of  cells in the prostate gland.  Treatment for this condition depends on several factors, including the stage of the cancer, your age, personal preferences, and your overall health. Talk with your health care provider about treatment options that are recommended for you.  Consider joining a support group for men who have prostate cancer. Meeting with a support group may help you learn to cope with the stress of having cancer. This information is not intended to replace advice given to you by your health care provider. Make sure you discuss any questions you have with your health care provider. Document Released: 12/30/2004 Document Revised: 12/12/2016 Document Reviewed: 09/10/2015 Elsevier Patient Education  2020 [employer].

## 2018-12-02 ENCOUNTER — Ambulatory Visit: Payer: 59 | Admitting: Urology

## 2019-02-10 ENCOUNTER — Other Ambulatory Visit: Payer: Self-pay

## 2019-02-10 ENCOUNTER — Encounter: Payer: Self-pay | Admitting: Family Medicine

## 2019-02-10 ENCOUNTER — Ambulatory Visit (INDEPENDENT_AMBULATORY_CARE_PROVIDER_SITE_OTHER): Payer: 59 | Admitting: Family Medicine

## 2019-02-10 VITALS — BP 157/96 | HR 88 | Temp 97.1°F | Resp 18 | Ht 67.0 in | Wt 184.0 lb

## 2019-02-10 DIAGNOSIS — L290 Pruritus ani: Secondary | ICD-10-CM

## 2019-02-10 DIAGNOSIS — E7849 Other hyperlipidemia: Secondary | ICD-10-CM | POA: Diagnosis not present

## 2019-02-10 DIAGNOSIS — C61 Malignant neoplasm of prostate: Secondary | ICD-10-CM

## 2019-02-10 DIAGNOSIS — K5790 Diverticulosis of intestine, part unspecified, without perforation or abscess without bleeding: Secondary | ICD-10-CM | POA: Diagnosis not present

## 2019-02-10 DIAGNOSIS — R030 Elevated blood-pressure reading, without diagnosis of hypertension: Secondary | ICD-10-CM

## 2019-02-10 DIAGNOSIS — N529 Male erectile dysfunction, unspecified: Secondary | ICD-10-CM

## 2019-02-10 MED ORDER — HYDROCORTISONE (PERIANAL) 2.5 % EX CREA: 1.0000 "application " | TOPICAL_CREAM | Freq: Two times a day (BID) | CUTANEOUS | 0 refills | Status: DC

## 2019-02-10 NOTE — Progress Notes (Signed)
Patient: Cody Rush Male    DOB: October 20, 1955   64 y.o.   MRN: [id number] Visit Date: 02/10/2019  Today's Provider: [name], MD   Chief Complaint  Patient presents with  . Follow-up   Subjective:     HPI  Patient is also for follow-up of blood pressure, reflux, and itching in his anal area in recent months.  He has had this treated before.  No bleeding and no pain. Follow up elevated PSA from 08/05/2018. Patient was referred to Urology.  Low-grade prostate cancer being followed by urology. No Known Allergies   Current Outpatient Medications:  .  aspirin 81 MG tablet, Take 81 mg by mouth daily. , Disp: , Rfl:  .  lansoprazole (PREVACID) 30 MG capsule, TAKE 1 CAPSULE DAILY, Disp: 90 capsule, Rfl: 3 .  MULTIPLE VITAMINS PO, Take 1 tablet by mouth daily. , Disp: , Rfl:  .  Omega-3 Fatty Acids (FISH OIL) 1200 MG CAPS, Take 1 capsule by mouth daily. , Disp: , Rfl:  .  sildenafil (REVATIO) 20 MG tablet, 1 to 4 tablets daily as needed, Disp: 50 tablet, Rfl: 5 .  tadalafil (CIALIS) 20 MG tablet, Take 1 tablet every 3 days as needed., Disp: 30 tablet, Rfl: 5 .  cyclobenzaprine (FLEXERIL) 10 MG tablet, , Disp: , Rfl:   Review of Systems  Constitutional: Negative for appetite change, chills and fever.  HENT: Negative.   Eyes: Negative.   Respiratory: Negative for chest tightness, shortness of breath and wheezing.   Cardiovascular: Negative for chest pain and palpitations.  Gastrointestinal: Negative for abdominal pain, nausea and vomiting.       Perianal itching.  Endocrine: Negative.   Genitourinary:       ED slowly getting worse.  Allergic/Immunologic: Negative.   Neurological: Negative.   Hematological: Negative.   Psychiatric/Behavioral: Negative.     Social History   Tobacco Use  . Smoking status: Never Smoker  . Smokeless tobacco: Former Systems developer    Types: Chew  Substance Use Topics  . Alcohol use: No    Alcohol/week: 0.0 standard drinks        Objective:   BP (!) 157/96 (BP Location: Right Arm, Cuff Size: Large)   Pulse 88   Temp (!) 97.1 F (36.2 C) (Other (Comment))   Resp 18   Ht 5\' 7"  (1.702 m)   Wt 184 lb (83.5 kg)   SpO2 96%   BMI 28.82 kg/m  Vitals:   02/10/19 1610 02/10/19 1618  BP: (!) 147/98 (!) 157/96  Pulse: 89 88  Resp: 18   Temp: (!) 97.1 F (36.2 C)   TempSrc: Other (Comment)   SpO2: 96%   Weight: 184 lb (83.5 kg)   Height: 5\' 7"  (1.702 m)   Body mass index is 28.82 kg/m.   Physical Exam Vitals reviewed.  Constitutional:      Appearance: He is well-developed.  HENT:     Head: Normocephalic and atraumatic.     Right Ear: External ear normal.     Left Ear: External ear normal.     Nose: Nose normal.  Eyes:     Pupils: Pupils are equal, round, and reactive to light.  Cardiovascular:     Rate and Rhythm: Normal rate and regular rhythm.     Heart sounds: Normal heart sounds.  Pulmonary:     Effort: Pulmonary effort is normal.     Breath sounds: Normal breath sounds.  Abdominal:  General: Bowel sounds are normal.     Palpations: Abdomen is soft.  Genitourinary:    Rectum: Normal.     Comments: Perianal exam is normal. Musculoskeletal:        General: Normal range of motion.     Cervical back: Normal range of motion and neck supple.  Skin:    General: Skin is warm and dry.  Neurological:     General: No focal deficit present.     Mental Status: He is alert and oriented to person, place, and time.  Psychiatric:        Mood and Affect: Mood normal.        Behavior: Behavior normal.        Thought Content: Thought content normal.        Judgment: Judgment normal.      No results found for any visits on 02/10/19.     Assessment & Plan    1. Pruritus ani  - hydrocortisone (PROCTOZONE-HC) 2.5 % rectal cream; Place 1 application rectally 2 (two) times daily.  Dispense: 28 g; Refill: 0  2. DD (diverticular disease)   3. Other hyperlipidemia   4. Prostate cancer  [facility]) Followed closely by urology.  5. Elevated blood pressure reading Patient to work on more regular exercise and we will see him back in 2 to 3 months.  Consider starting ARB then.  6. Erectile dysfunction, unspecified erectile dysfunction type Slowly worsening. 7.GERD On prevacid     [name], MD  [facility]

## 2019-05-12 ENCOUNTER — Ambulatory Visit: Payer: Self-pay | Admitting: Family Medicine

## 2019-08-11 ENCOUNTER — Other Ambulatory Visit: Payer: Self-pay

## 2019-08-11 ENCOUNTER — Encounter: Payer: Self-pay | Admitting: Family Medicine

## 2019-08-11 ENCOUNTER — Ambulatory Visit (INDEPENDENT_AMBULATORY_CARE_PROVIDER_SITE_OTHER): Payer: 59 | Admitting: Family Medicine

## 2019-08-11 VITALS — BP 165/94 | HR 81 | Temp 98.3°F | Ht 67.0 in | Wt 183.2 lb

## 2019-08-11 DIAGNOSIS — Z0000 Encounter for general adult medical examination without abnormal findings: Secondary | ICD-10-CM

## 2019-08-11 DIAGNOSIS — R030 Elevated blood-pressure reading, without diagnosis of hypertension: Secondary | ICD-10-CM | POA: Diagnosis not present

## 2019-08-11 DIAGNOSIS — C61 Malignant neoplasm of prostate: Secondary | ICD-10-CM

## 2019-08-11 DIAGNOSIS — E7849 Other hyperlipidemia: Secondary | ICD-10-CM

## 2019-08-11 DIAGNOSIS — K219 Gastro-esophageal reflux disease without esophagitis: Secondary | ICD-10-CM

## 2019-08-11 DIAGNOSIS — N529 Male erectile dysfunction, unspecified: Secondary | ICD-10-CM

## 2019-08-11 MED ORDER — TADALAFIL 20 MG PO TABS: ORAL_TABLET | ORAL | 5 refills | Status: DC

## 2019-08-11 NOTE — Patient Instructions (Signed)
GET WELCH ALLYN OR OMRON BP CUFF!!!

## 2019-08-11 NOTE — Progress Notes (Signed)
Complete physical exam   Patient: Cody Rush   DOB: 07/24/1955   64 y.o. Male  MRN: [id number] Visit Date: 08/11/2019  Today's healthcare provider: [name], MD   Chief Complaint  Patient presents with  . Annual Exam   Subjective    Cody Rush is a 64 y.o. male who presents today for a complete physical exam.  He reports consuming a general diet. Home exercise routine includes biking. He generally feels well. He reports sleeping well. He does not have additional problems to discuss today.  HPI    Past Medical History:  Diagnosis Date  . Prostate cancer [facility])    Past Surgical History:  Procedure Laterality Date  . KNEE SURGERY    . LITHOTRIPSY    . THORACOSCOPY  11/2015   Social History   Socioeconomic History  . Marital status: Single    Spouse name: Not on file  . Number of children: Not on file  . Years of education: Not on file  . Highest education level: Not on file  Occupational History  . Not on file  Tobacco Use  . Smoking status: Never Smoker  . Smokeless tobacco: Former Systems developer    Types: Secondary school teacher  . Vaping Use: Never used  Substance and Sexual Activity  . Alcohol use: No    Alcohol/week: 0.0 standard drinks  . Drug use: No  . Sexual activity: Yes    Birth control/protection: None  Other Topics Concern  . Not on file  Social History Narrative  . Not on file   Social Determinants of Health   Financial Resource Strain:   . Difficulty of Paying Living Expenses:   Food Insecurity:   . Worried About Charity fundraiser in the Last Year:   . Arboriculturist in the Last Year:   Transportation Needs:   . Film/video editor (Medical):   Marland Kitchen Lack of Transportation (Non-Medical):   Physical Activity:   . Days of Exercise per Week:   . Minutes of Exercise per Session:   Stress:   . Feeling of Stress :   Social Connections:   . Frequency of Communication with Friends and Family:   . Frequency of Social  Gatherings with Friends and Family:   . Attends Religious Services:   . Active Member of Clubs or Organizations:   . Attends Archivist Meetings:   Marland Kitchen Marital Status:   Intimate Partner Violence:   . Fear of Current or Ex-Partner:   . Emotionally Abused:   Marland Kitchen Physically Abused:   . Sexually Abused:    Family Status  Relation Name Status  . Mother  Deceased at age 48  . Father  Deceased at age 31  . Sister  Alive  . Sister  Alive   Family History  Problem Relation Age of Onset  . Breast cancer Mother   . Colon cancer Father   . Stroke Father   . Heart disease Father    No Known Allergies  Patient Care Team: Jerrol Banana., MD as PCP - General (Family Medicine)   Medications: Outpatient Medications Prior to Visit  Medication Sig  . aspirin 81 MG tablet Take 81 mg by mouth daily.   . cyclobenzaprine (FLEXERIL) 10 MG tablet   . hydrocortisone (PROCTOZONE-HC) 2.5 % rectal cream Place 1 application rectally 2 (two) times daily.  . lansoprazole (PREVACID) 30 MG capsule TAKE 1 CAPSULE DAILY  . MULTIPLE  VITAMINS PO Take 1 tablet by mouth daily.   . Omega-3 Fatty Acids (FISH OIL) 1200 MG CAPS Take 1 capsule by mouth daily.   . sildenafil (REVATIO) 20 MG tablet 1 to 4 tablets daily as needed  . tadalafil (CIALIS) 20 MG tablet Take 1 tablet every 3 days as needed.   No facility-administered medications prior to visit.    Review of Systems  Constitutional: Negative.   HENT: Positive for dental problem.   Eyes: Negative.   Respiratory: Negative.   Cardiovascular: Positive for leg swelling.  Gastrointestinal: Negative.   Endocrine: Negative.   Genitourinary: Negative.   Musculoskeletal: Positive for neck pain and neck stiffness.  Skin: Negative.   Allergic/Immunologic: Negative.   Neurological: Negative.   Hematological: Negative.   Psychiatric/Behavioral: Negative.        Objective    There were no vitals taken for this visit.    Physical  Exam Vitals reviewed.  Constitutional:      Appearance: He is well-developed.  HENT:     Head: Normocephalic and atraumatic.     Right Ear: External ear normal.     Left Ear: External ear normal.     Nose: Nose normal.  Eyes:     Pupils: Pupils are equal, round, and reactive to light.  Cardiovascular:     Rate and Rhythm: Normal rate and regular rhythm.     Heart sounds: Normal heart sounds.  Pulmonary:     Effort: Pulmonary effort is normal.     Breath sounds: Normal breath sounds.  Abdominal:     General: Bowel sounds are normal.     Palpations: Abdomen is soft.  Genitourinary:    Penis: Normal.      Testes: Normal.     Prostate: Normal.     Rectum: Normal.  Musculoskeletal:        General: Normal range of motion.     Cervical back: Normal range of motion and neck supple.  Skin:    General: Skin is warm and dry.  Neurological:     General: No focal deficit present.     Mental Status: He is alert and oriented to person, place, and time.  Psychiatric:        Mood and Affect: Mood normal.        Behavior: Behavior normal.        Thought Content: Thought content normal.        Judgment: Judgment normal.       Last depression screening scores PHQ 2/9 Scores 07/20/2017 07/09/2016  PHQ - 2 Score 0 0  PHQ- 9 Score - 0   Last fall risk screening Fall Risk  07/20/2017  Falls in the past year? No  Number falls in past yr: -  Injury with Fall? -   Last Audit-C alcohol use screening Alcohol Use Disorder Test (AUDIT) 07/20/2017  1. How often do you have a drink containing alcohol? 0   A score of 3 or more in women, and 4 or more in men indicates increased risk for alcohol abuse, EXCEPT if all of the points are from question 1   No results found for any visits on 08/11/19.  Assessment & Plan    Routine Health Maintenance and Physical Exam  Exercise Activities and Dietary recommendations Goals   None     Immunization History  Administered Date(s) Administered  .  Influenza Whole 12/14/2009  . Influenza,inj,Quad PF,6+ Mos 11/20/2015  . Td 12/14/2009  . Tdap 03/08/2008  .  Zoster 05/03/2015    Health Maintenance  Topic Date Due  . COVID-19 Vaccine (1) Never done  . HIV Screening  Never done  . PNA vac Low Risk Adult (1 of 2 - PCV13) Never done  . INFLUENZA VACCINE  08/14/2019  . TETANUS/TDAP  12/15/2019  . COLONOSCOPY  06/25/2025  . Hepatitis C Screening  Completed    Discussed health benefits of physical activity, and encouraged him to engage in regular exercise appropriate for his age and condition. 1. Annual physical exam Follow-up 1 year.  2. Prostate cancer ([place])  - PSA  3. Elevated blood pressure reading Get blood pressure cuff for home use.  Regular readings and follow-up in a few months. - CBC with Differential/Platelet - Comprehensive metabolic panel - TSH - Lipid panel  4. Other hyperlipidemia  - CBC with Differential/Platelet - Comprehensive metabolic panel - TSH - Lipid panel  5. Gastroesophageal reflux disease, unspecified whether esophagitis present  - CBC with Differential/Platelet - Comprehensive metabolic panel - TSH - Lipid panel  6. Erectile dysfunction, unspecified erectile dysfunction type  - tadalafil (CIALIS) 20 MG tablet; Take 1 tablet every 3 days as needed.  Dispense: 30 tablet; Refill: 5    No follow-ups on file.        Rodrecus Belsky Cranford Mon, MD  [facility] [phone] (phone) [phone] (fax)  [place]

## 2019-08-13 LAB — COMPREHENSIVE METABOLIC PANEL
ALT: 50 IU/L — ABNORMAL HIGH (ref 0–44)
AST: 39 IU/L (ref 0–40)
Albumin/Globulin Ratio: 1.5 (ref 1.2–2.2)
Albumin: 4.2 g/dL (ref 3.8–4.8)
Alkaline Phosphatase: 59 IU/L (ref 48–121)
BUN/Creatinine Ratio: 14 (ref 10–24)
BUN: 18 mg/dL (ref 8–27)
Bilirubin Total: 1 mg/dL (ref 0.0–1.2)
CO2: 22 mmol/L (ref 20–29)
Calcium: 9.2 mg/dL (ref 8.6–10.2)
Chloride: 103 mmol/L (ref 96–106)
Creatinine, Ser: 1.27 mg/dL (ref 0.76–1.27)
GFR calc Af Amer: 68 mL/min/{1.73_m2} (ref >59)
GFR calc non Af Amer: 59 mL/min/{1.73_m2} — ABNORMAL LOW (ref >59)
Globulin, Total: 2.8 g/dL (ref 1.5–4.5)
Glucose: 87 mg/dL (ref 65–99)
Potassium: 4.6 mmol/L (ref 3.5–5.2)
Sodium: 140 mmol/L (ref 134–144)
Total Protein: 7 g/dL (ref 6.0–8.5)

## 2019-08-13 LAB — CBC WITH DIFFERENTIAL/PLATELET
Basophils Absolute: 0 10*3/uL (ref 0.0–0.2)
Basos: 1 %
EOS (ABSOLUTE): 0.1 10*3/uL (ref 0.0–0.4)
Eos: 2 %
Hematocrit: 44.2 % (ref 37.5–51.0)
Hemoglobin: 15.4 g/dL (ref 13.0–17.7)
Immature Grans (Abs): 0 10*3/uL (ref 0.0–0.1)
Immature Granulocytes: 0 %
Lymphocytes Absolute: 1.2 10*3/uL (ref 0.7–3.1)
Lymphs: 28 %
MCH: 30.9 pg (ref 26.6–33.0)
MCHC: 34.8 g/dL (ref 31.5–35.7)
MCV: 89 fL (ref 79–97)
Monocytes Absolute: 0.5 10*3/uL (ref 0.1–0.9)
Monocytes: 13 %
Neutrophils Absolute: 2.4 10*3/uL (ref 1.4–7.0)
Neutrophils: 56 %
Platelets: 187 10*3/uL (ref 150–450)
RBC: 4.98 x10E6/uL (ref 4.14–5.80)
RDW: 12.7 % (ref 11.6–15.4)
WBC: 4.2 10*3/uL (ref 3.4–10.8)

## 2019-08-13 LAB — LIPID PANEL
Chol/HDL Ratio: 4.8 ratio (ref 0.0–5.0)
Cholesterol, Total: 174 mg/dL (ref 100–199)
HDL: 36 mg/dL — ABNORMAL LOW (ref >39)
LDL Chol Calc (NIH): 117 mg/dL — ABNORMAL HIGH (ref 0–99)
Triglycerides: 116 mg/dL (ref 0–149)
VLDL Cholesterol Cal: 21 mg/dL (ref 5–40)

## 2019-08-13 LAB — TSH: TSH: 2.26 u[IU]/mL (ref 0.450–4.500)

## 2019-08-13 LAB — PSA: Prostate Specific Ag, Serum: 4.2 ng/mL — ABNORMAL HIGH (ref 0.0–4.0)

## 2019-08-14 ENCOUNTER — Other Ambulatory Visit: Payer: Self-pay | Admitting: Family Medicine

## 2019-08-14 NOTE — Telephone Encounter (Signed)
Requested Prescriptions  Pending Prescriptions Disp Refills  . lansoprazole (PREVACID) 30 MG capsule [Pharmacy Med Name: LANSOPRAZOLE DR CAPS 30MG ] 90 capsule 3    Sig: TAKE 1 CAPSULE DAILY     Gastroenterology: Proton Pump Inhibitors Passed - 08/14/2019  7:39 AM      Passed - Valid encounter within last 12 months    Recent Outpatient Visits          3 days ago Prostate cancer [facility])   [facility] Jerrol Banana., MD   6 months ago Pruritus ani   [facility] Jerrol Banana., MD   1 year ago Annual physical exam   [facility] Jerrol Banana., MD   2 years ago Annual physical exam   [facility] Jerrol Banana., MD   3 years ago Annual physical exam   [facility] Jerrol Banana., MD      Future Appointments            In 1 month Diamantina [place], Herbert Seta, MD [place]   In 1 year Jerrol Banana., MD [facility] [place]

## 2019-08-17 ENCOUNTER — Telehealth: Payer: Self-pay

## 2019-08-17 NOTE — Telephone Encounter (Signed)
-----   Message from Jerrol Banana., MD sent at 08/17/2019  7:55 AM EDT ----- Labs all stable including PSA.

## 2019-08-17 NOTE — Telephone Encounter (Signed)
LMTCB, [facility] Triage Nurse may give patient results  

## 2019-08-18 NOTE — Telephone Encounter (Signed)
Attempted to call pt. Left vm to call office to discuss lab results.

## 2019-08-18 NOTE — Telephone Encounter (Signed)
Patient notified of lab result.

## 2019-08-30 ENCOUNTER — Telehealth: Payer: Self-pay | Admitting: Urology

## 2019-08-30 NOTE — Telephone Encounter (Signed)
Pt called office today and has bx scheduled for this Thursday with [facility].  He is unsure of bx instructions and requested someone call to go over this with him.

## 2019-08-30 NOTE — Telephone Encounter (Signed)
Called pt, no answer. LM on machine informing pt of prostate biopsy instructions. Also asked pt to call back to clarify if he is still taking ASA 81mg .

## 2019-08-31 NOTE — Telephone Encounter (Signed)
Spoke with patient and went over prostate biopsy instructions, pt understood and did not have any further questions. Also pt stopped the aspirin over a week ago.

## 2019-09-01 ENCOUNTER — Encounter: Payer: Self-pay | Admitting: Urology

## 2019-09-01 ENCOUNTER — Other Ambulatory Visit: Payer: Self-pay | Admitting: Urology

## 2019-09-01 ENCOUNTER — Ambulatory Visit (INDEPENDENT_AMBULATORY_CARE_PROVIDER_SITE_OTHER): Payer: 59 | Admitting: Urology

## 2019-09-01 ENCOUNTER — Other Ambulatory Visit: Payer: Self-pay

## 2019-09-01 VITALS — BP 152/98 | HR 86 | Ht 67.0 in | Wt 178.8 lb

## 2019-09-01 DIAGNOSIS — C61 Malignant neoplasm of prostate: Secondary | ICD-10-CM

## 2019-09-01 MED ORDER — GENTAMICIN SULFATE 40 MG/ML IJ SOLN
80.0000 mg | Freq: Once | INTRAMUSCULAR | Status: AC
  Administered 2019-09-01: 80 mg via INTRAMUSCULAR

## 2019-09-01 MED ORDER — LEVOFLOXACIN 500 MG PO TABS
500.0000 mg | ORAL_TABLET | Freq: Once | ORAL | Status: AC
  Administered 2019-09-01: 500 mg via ORAL

## 2019-09-01 NOTE — Patient Instructions (Signed)

## 2019-09-01 NOTE — Progress Notes (Signed)
   09/01/19  Indication: Very low risk prostate cancer, confirmatory biopsy  Prostate Biopsy Procedure   Informed consent was obtained, and we discussed the risks of bleeding and infection/sepsis. A time out was performed to ensure correct patient identity.  Pre-Procedure: -Last PSA value: 4.2, 08/12/2019 - Gentamicin and levaquin given for antibiotic prophylaxis - Transrectal Ultrasound performed revealing a 53 gm prostate, PSA density 0.08 - No significant hypoechoic or median lobe noted  Procedure: - Prostate block performed using 10 cc 1% lidocaine and biopsies taken from sextant areas, a total of 12 under ultrasound guidance.  Post-Procedure: - Patient tolerated the procedure well - He was counseled to seek immediate medical attention if experiences significant bleeding, fevers, or severe pain - Return in one week to discuss biopsy results  Assessment/ Plan: Will follow up in 1-2 weeks to discuss pathology  Prior biopsy 11/18/2018 showed 2/12 cores positive for grade group 1 gleason score 3+3=6 prostate cancer, with 7% max core involvement.  Nickolas Madrid, MD 09/01/2019

## 2019-09-05 LAB — SURGICAL PATHOLOGY

## 2019-09-06 LAB — PATHOLOGY

## 2019-09-15 ENCOUNTER — Other Ambulatory Visit: Payer: Self-pay

## 2019-09-15 ENCOUNTER — Ambulatory Visit (INDEPENDENT_AMBULATORY_CARE_PROVIDER_SITE_OTHER): Payer: 59 | Admitting: Urology

## 2019-09-15 ENCOUNTER — Encounter: Payer: Self-pay | Admitting: Urology

## 2019-09-15 VITALS — BP 157/107 | HR 76 | Ht 67.0 in | Wt 183.0 lb

## 2019-09-15 DIAGNOSIS — C61 Malignant neoplasm of prostate: Secondary | ICD-10-CM | POA: Diagnosis not present

## 2019-09-15 NOTE — Progress Notes (Signed)
   09/15/2019 11:51 AM   [name] Aug 23, 1955 [id number]  Reason for visit: Discuss confirmatory prostate biopsy results  HPI: I saw Mr. Losito in urology clinic to review the results of his confirmatory prostate biopsy.  Briefly, he is a healthy 64 year old male who presented with a elevated PSA of 4.6 with 20% free in November 2020.  Prostate biopsy at that time showed a 52 g gland with a PSA density of 0.09, 2/12 cores positive for gleason score 3+3=6 prostate cancer with 7% max core involvement.  This placed him in the very low risk category per the AUA guidelines.  He underwent a confirmatory biopsy on 09/01/2019 showing a 53 g prostate with PSA density of 0.08, and 0/12 cores showed prostate cancer.  We reviewed these results at length today.  This very reassuring and suggest that he has a very low volume of very low risk prostate cancer, and he is an excellent candidate to continue active surveillance.  We discussed the extremely low, but nonzero, risk of developing metastatic disease while on active surveillance.  We discussed the need for ongoing PSA monitoring and DRE every 6 to 12 months, and consideration of repeat biopsy every 3 to 5 years unless rising PSA prompted biopsy sooner.  We also discussed the controversial role of prostate MRI in active surveillance if he had a significant rise in the PSA.  Continue active surveillance for very low risk prostate cancer RTC 9 months for DRE with PSA prior   Billey Co, MD  [place] [address] [place], [place] [address] [phone]

## 2019-09-15 NOTE — Patient Instructions (Addendum)
Continue active surveillance for very low risk prostate cancer

## 2019-10-20 ENCOUNTER — Ambulatory Visit: Payer: Self-pay | Admitting: Family Medicine

## 2019-10-20 NOTE — Progress Notes (Signed)
I,[name],acting as a scribe for Cody Durie, MD.,have documented all relevant documentation on the behalf of Cody Durie, MD,as directed by  Cody Durie, MD while in the presence of Cody Durie, MD.   Established patient visit   Patient: Cody Rush   DOB: 12/25/1955   64 y.o. Male  MRN: [id number] Visit Date: 10/24/2019  Today's healthcare provider: [name], MD   Chief Complaint  Patient presents with  . Follow-up  . Hypertension   Subjective    HPI  Blood pressure at home running 140s to 150s over 90s to 100. Elevated blood pressure reading From 08/11/2019-Get blood pressure cuff for home use.  Regular readings and follow-up in a few months. Patient states his blood pressures at home have been averaging 140/90-150/100.       Medications: Outpatient Medications Prior to Visit  Medication Sig  . aspirin 81 MG tablet Take 81 mg by mouth daily.   . cyclobenzaprine (FLEXERIL) 10 MG tablet   . hydrocortisone (PROCTOZONE-HC) 2.5 % rectal cream Place 1 application rectally 2 (two) times daily.  . lansoprazole (PREVACID) 30 MG capsule TAKE 1 CAPSULE DAILY  . MULTIPLE VITAMINS PO Take 1 tablet by mouth daily.   . Omega-3 Fatty Acids (FISH OIL) 1200 MG CAPS Take 1 capsule by mouth daily.   . sildenafil (REVATIO) 20 MG tablet 1 to 4 tablets daily as needed  . tadalafil (CIALIS) 20 MG tablet Take 1 tablet every 3 days as needed.   No facility-administered medications prior to visit.    Review of Systems  Constitutional: Negative for appetite change, chills and fever.  Respiratory: Negative for chest tightness, shortness of breath and wheezing.   Cardiovascular: Negative for chest pain and palpitations.  Gastrointestinal: Negative for abdominal pain, nausea and vomiting.       Objective    There were no vitals taken for this visit. BP Readings from Last 3 Encounters:  10/24/19 (!) 155/110  09/15/19 (!) 157/107  09/01/19  (!) 152/98   Wt Readings from Last 3 Encounters:  10/24/19 180 lb (81.6 kg)  09/15/19 183 lb (83 kg)  09/01/19 178 lb 12.8 oz (81.1 kg)      Physical Exam Vitals reviewed.  Constitutional:      Appearance: He is well-developed.  HENT:     Head: Normocephalic and atraumatic.     Right Ear: External ear normal.     Left Ear: External ear normal.     Nose: Nose normal.  Eyes:     Pupils: Pupils are equal, round, and reactive to light.  Cardiovascular:     Rate and Rhythm: Normal rate and regular rhythm.     Heart sounds: Normal heart sounds.  Pulmonary:     Effort: Pulmonary effort is normal.     Breath sounds: Normal breath sounds.  Abdominal:     General: Bowel sounds are normal.     Palpations: Abdomen is soft.  Musculoskeletal:     Cervical back: Normal range of motion and neck supple.  Skin:    General: Skin is warm and dry.  Neurological:     General: No focal deficit present.     Mental Status: He is alert and oriented to person, place, and time.  Psychiatric:        Mood and Affect: Mood normal.        Behavior: Behavior normal.        Thought Content: Thought content normal.  Judgment: Judgment normal.       No results found for any visits on 10/24/19.  Assessment & Plan     1. Primary hypertension Return to clinic 1 to 2 months - amLODipine (NORVASC) 5 MG tablet; Take 1 tablet (5 mg total) by mouth daily.  Dispense: 90 tablet; Refill: 3  2. Other hyperlipidemia    No follow-ups on file.      I, Cody Durie, MD, have reviewed all documentation for this visit. The documentation on 10/29/19 for the exam, diagnosis, procedures, and orders are all accurate and complete.    Cody Futch Cranford Mon, MD  [facility] [phone] (phone) [phone] (fax)  [place]

## 2019-10-24 ENCOUNTER — Other Ambulatory Visit: Payer: Self-pay

## 2019-10-24 ENCOUNTER — Encounter: Payer: Self-pay | Admitting: Family Medicine

## 2019-10-24 ENCOUNTER — Ambulatory Visit (INDEPENDENT_AMBULATORY_CARE_PROVIDER_SITE_OTHER): Payer: 59 | Admitting: Family Medicine

## 2019-10-24 VITALS — BP 155/110 | HR 67 | Temp 97.8°F | Resp 16 | Ht 67.0 in | Wt 180.0 lb

## 2019-10-24 DIAGNOSIS — I10 Essential (primary) hypertension: Secondary | ICD-10-CM

## 2019-10-24 DIAGNOSIS — E7849 Other hyperlipidemia: Secondary | ICD-10-CM | POA: Diagnosis not present

## 2019-10-24 MED ORDER — AMLODIPINE BESYLATE 5 MG PO TABS: 5.0000 mg | ORAL_TABLET | Freq: Every day | ORAL | 3 refills | Status: DC

## 2020-01-23 ENCOUNTER — Other Ambulatory Visit: Payer: Self-pay

## 2020-01-23 ENCOUNTER — Encounter: Payer: Self-pay | Admitting: Family Medicine

## 2020-01-23 ENCOUNTER — Ambulatory Visit (INDEPENDENT_AMBULATORY_CARE_PROVIDER_SITE_OTHER): Payer: 59 | Admitting: Family Medicine

## 2020-01-23 VITALS — BP 139/92 | HR 81 | Temp 98.6°F | Wt 181.0 lb

## 2020-01-23 DIAGNOSIS — K4090 Unilateral inguinal hernia, without obstruction or gangrene, not specified as recurrent: Secondary | ICD-10-CM | POA: Diagnosis not present

## 2020-01-23 NOTE — Progress Notes (Signed)
Established patient visit   Patient: Cody Rush   DOB: Dec 21, 1955   65 y.o. Male  MRN: [id number] Visit Date: 01/23/2020  Today's healthcare provider: [name], MD   Chief Complaint  Patient presents with  . Hypertension   Subjective    HPI  Patient complains of nontender lump in his right groin.  No GI or GU symptoms.  No systemic symptoms. Wife retired last month and he is retiring from work at the end of this month.  Hypertension, follow-up  BP Readings from Last 3 Encounters:  01/23/20 (!) 139/92  10/24/19 (!) 155/110  09/15/19 (!) 157/107   Wt Readings from Last 3 Encounters:  01/23/20 181 lb (82.1 kg)  10/24/19 180 lb (81.6 kg)  09/15/19 183 lb (83 kg)     He was last seen for hypertension 3 months ago.  Management since that visit includes starting amlodipine 5mg  daily.  He reports excellent compliance with treatment. He is not having side effects.  He is following a Regular diet. He is exercising. He does not smoke.  Use of agents associated with hypertension: none.   Outside blood pressures are not being checked.  Symptoms: No chest pain No chest pressure  No palpitations No syncope  No dyspnea No orthopnea  No paroxysmal nocturnal dyspnea No lower extremity edema   Pertinent labs: Lab Results  Component Value Date   CHOL 174 08/12/2019   HDL 36 (L) 08/12/2019   LDLCALC 117 (H) 08/12/2019   TRIG 116 08/12/2019   CHOLHDL 4.8 08/12/2019   Lab Results  Component Value Date   NA 140 08/12/2019   K 4.6 08/12/2019   CREATININE 1.27 08/12/2019   GFRNONAA 59 (L) 08/12/2019   GFRAA 68 08/12/2019   GLUCOSE 87 08/12/2019     The 10-year ASCVD risk score Mikey Bussing DC Jr., et al., 2013) is: 19.8%   ---------------------------------------------------------------------------------------------------   Patient Active Problem List   Diagnosis Date Noted  . Fracture of left tibial plateau 11/16/2015  . Fracture of multiple ribs of  left side 11/16/2015  . Sternal manubrial dissociation, closed fracture 11/16/2015  . Trauma 11/16/2015  . Allergic rhinitis 02/01/2015  . DD (diverticular disease) 02/01/2015  . Acid reflux 02/01/2015  . Hepatitis 02/01/2015  . H/O disease 02/01/2015  . H/O renal calculi 02/01/2015  . HLD (hyperlipidemia) 02/01/2015  . Plantar fasciitis 02/01/2015  . UNSPECIFIED DISORDER OF EYE 08/16/2009  . ALLERGIC RHINITIS 08/16/2009  . GERD 08/16/2009  . CHOLELITHIASIS 08/16/2009   Past Medical History:  Diagnosis Date  . Prostate cancer [facility])    Social History   Tobacco Use  . Smoking status: Never Smoker  . Smokeless tobacco: Former Systems developer    Types: Secondary school teacher  . Vaping Use: Never used  Substance Use Topics  . Alcohol use: No    Alcohol/week: 0.0 standard drinks  . Drug use: No   No Known Allergies   Medications: Outpatient Medications Prior to Visit  Medication Sig  . amLODipine (NORVASC) 5 MG tablet Take 1 tablet (5 mg total) by mouth daily.  Marland Kitchen aspirin 81 MG tablet Take 81 mg by mouth daily.   . cyclobenzaprine (FLEXERIL) 10 MG tablet   . hydrocortisone (PROCTOZONE-HC) 2.5 % rectal cream Place 1 application rectally 2 (two) times daily.  . lansoprazole (PREVACID) 30 MG capsule TAKE 1 CAPSULE DAILY  . MULTIPLE VITAMINS PO Take 1 tablet by mouth daily.   . Omega-3 Fatty Acids (FISH OIL) 1200  MG CAPS Take 1 capsule by mouth daily.   . sildenafil (REVATIO) 20 MG tablet 1 to 4 tablets daily as needed  . tadalafil (CIALIS) 20 MG tablet Take 1 tablet every 3 days as needed.   No facility-administered medications prior to visit.    Review of Systems  Constitutional: Negative.   Respiratory: Negative.   Cardiovascular: Negative.   Gastrointestinal: Negative.   Neurological: Negative for dizziness, light-headedness and headaches.      Objective    BP (!) 139/92 (BP Location: Left Arm, Patient Position: Sitting, Cuff Size: Large)   Pulse 81   Temp 98.6 F (37 C)  (Oral)   Wt 181 lb (82.1 kg)   SpO2 99%   BMI 28.35 kg/m    Physical Exam Vitals reviewed.  Constitutional:      Appearance: He is well-developed.  HENT:     Head: Normocephalic and atraumatic.     Right Ear: External ear normal.     Left Ear: External ear normal.     Nose: Nose normal.  Eyes:     Pupils: Pupils are equal, round, and reactive to light.  Cardiovascular:     Rate and Rhythm: Normal rate and regular rhythm.     Heart sounds: Normal heart sounds.  Pulmonary:     Effort: Pulmonary effort is normal.     Breath sounds: Normal breath sounds.  Abdominal:     General: Bowel sounds are normal.     Palpations: Abdomen is soft.  Genitourinary:    Testes: Normal.     Comments: No inguinal adenopathy.  There is a right inguinal hernia present that is easily reducible. Musculoskeletal:     Cervical back: Normal range of motion and neck supple.  Skin:    General: Skin is warm and dry.  Neurological:     General: No focal deficit present.     Mental Status: He is alert and oriented to person, place, and time.  Psychiatric:        Mood and Affect: Mood normal.        Behavior: Behavior normal.        Thought Content: Thought content normal.        Judgment: Judgment normal.       No results found for any visits on 01/23/20.  Assessment & Plan     1. Non-recurrent unilateral inguinal hernia without obstruction or gangrene Refer to surgery.  Follow-up as needed. - Ambulatory referral to General Surgery   No follow-ups on file.      I, [name], MD, have reviewed all documentation for this visit. The documentation on 01/28/20 for the exam, diagnosis, procedures, and orders are all accurate and complete.    Sherrill Mckamie Cranford Mon, MD  [facility] [phone] (phone) [phone] (fax)  [place]

## 2020-02-23 ENCOUNTER — Other Ambulatory Visit: Payer: Self-pay | Admitting: General Surgery

## 2020-02-23 DIAGNOSIS — K4090 Unilateral inguinal hernia, without obstruction or gangrene, not specified as recurrent: Secondary | ICD-10-CM | POA: Diagnosis not present

## 2020-02-23 NOTE — Progress Notes (Signed)
Subjective:     Patient ID: Cody Rush is a 65 y.o. male.  HPI  The following portions of the patient's history were reviewed and updated as appropriate.  This is a new patient is here today for: office visit. Patient has been referred by Dr. Rosanna Randy for evaluation of a right inguinal hernia. The patient reports the hernia has been present for about 10 years.  (Reported to the patient on his annual exams with his PCP).  He states that it does not bother him. Patient reports he did feel a new knot in the right inguinal area a couple of months ago which he was concerned about. The patient states he has bowel movements once per day. He reports the stools being solid.   The patient is accompanied by his wife, Hoyle Sauer.        Chief Complaint  Patient presents with  . Inguinal Hernia    right     BP 128/74   Pulse 72   Temp 36.6 C (97.9 F)   Ht 175.3 cm (5' 9")   Wt 80.7 kg (178 lb)   SpO2 97%   BMI 26.29 kg/m       Past Medical History:  Diagnosis Date  . Prostate cancer (CMS-HCC)           Past Surgical History:  Procedure Laterality Date  . COLONOSCOPY  10/12/2004   Diverticulosis: CBF 09/2014  . COLONOSCOPY  06/26/2015   Int Hemorrhoids, Diverticulosis: CBF 06/2025  . knee surgery    . LITHOTRIPSY    . THORACOSCOPY  11/2015       Social History          Socioeconomic History  . Marital status: Married    Spouse name: Not on file  . Number of children: Not on file  . Years of education: Not on file  . Highest education level: Not on file  Occupational History  . Not on file  Tobacco Use  . Smoking status: Former Smoker    Start date: 1975    Quit date: 1975    Years since quitting: 47.1  . Smokeless tobacco: Former Systems developer    Types: Chew  Substance and Sexual Activity  . Alcohol use: Never  . Drug use: Never  . Sexual activity: Not on file  Other Topics Concern  . Not on file  Social History Narrative  . Not  on file   Social Determinants of Health   Financial Resource Strain: Not on file  Food Insecurity: Not on file  Transportation Needs: Not on file       No Known Allergies  Current Medications        Current Outpatient Medications  Medication Sig Dispense Refill  . amLODIPine (NORVASC) 5 MG tablet Take 5 mg by mouth once daily    . ASCORBATE CALCIUM (VITAMIN C ORAL) Take by mouth once daily.    Marland Kitchen aspirin 81 MG EC tablet Take by mouth once daily.    . cholecalciferol (VITAMIN D3) 1000 unit capsule Take 1,000 Units by mouth once daily    . CRANBERRY FRUIT EXTRACT (CRANBERRY CONCENTRATE ORAL) Take by mouth once daily.    . lansoprazole (PREVACID) 30 MG DR capsule Take by mouth once daily.    . multivitamin tablet Take by mouth once daily.    Marland Kitchen omega-3 fatty acids-fish oil 360-1,200 mg Cap Take by mouth once daily.    . flaxseed oil Oil Use once daily. (Patient not taking: Reported on 02/23/2020  )    .  polyethylene glycol (MIRALAX) powder Take as directed for colonoscopy prep. (Patient not taking: Reported on 02/23/2020  ) 255 g 0   No current facility-administered medications for this visit.           Family History  Problem Relation Age of Onset  . Breast cancer Mother   . Stroke Father   . Colon cancer Father   . Heart disease Father        Review of Systems  Constitutional: Negative for chills and fever.  Respiratory: Negative for cough.        Objective:   Physical Exam Constitutional:      Appearance: Normal appearance.  Cardiovascular:     Rate and Rhythm: Normal rate and regular rhythm.     Pulses: Normal pulses.     Heart sounds: Normal heart sounds.  Pulmonary:     Effort: Pulmonary effort is normal.     Breath sounds: Normal breath sounds.  Abdominal:     Hernia: A hernia is present. Hernia is present in the right inguinal area.  Genitourinary:    Testes: Normal.       Comments: Right inguinal hernia evident,  reducible in standing position.  Modest impulse on the left with cough.  No frank hernia. Neurological:     Mental Status: He is alert and oriented to person, place, and time.  Psychiatric:        Mood and Affect: Mood normal.        Behavior: Behavior normal.    Labs and Radiology:   September 01, 2019 laboratory review: Glucose 65 - 99 mg/dL 87   BUN 8 - 27 mg/dL 18   Creatinine, Ser 0.76 - 1.27 mg/dL 1.27   GFR calc non Af Amer >59 mL/min/1.73 59Low   GFR calc Af Amer >59 mL/min/1.73 68   Comment: **[facility] currently reports eGFR in compliance with the current**   recommendations of the [employer]. [facility] will   update reporting as new guidelines are published from the NKF-ASN   Task force.   BUN/Creatinine Ratio 10 - 24 14   Sodium 134 - 144 mmol/L 140   Potassium 3.5 - 5.2 mmol/L 4.6   Chloride 96 - 106 mmol/L 103   CO2 20 - 29 mmol/L 22   Calcium 8.6 - 10.2 mg/dL 9.2   Total Protein 6.0 - 8.5 g/dL 7.0   Albumin 3.8 - 4.8 g/dL 4.2   Globulin, Total 1.5 - 4.5 g/dL 2.8   Albumin/Globulin Ratio 1.2 - 2.2 1.5   Bilirubin Total 0.0 - 1.2 mg/dL 1.0   Alkaline Phosphatase 48 - 121 IU/L 59   AST 0 - 40 IU/L 39   ALT 0 - 44 IU/L 50High    WBC 3.4 - 10.8 x10E3/uL 4.2   RBC 4.14 - 5.80 x10E6/uL 4.98   Hemoglobin 13.0 - 17.7 g/dL 15.4   Hematocrit 37.5 - 51.0 % 44.2   MCV 79 - 97 fL 89   MCH 26.6 - 33.0 pg 30.9   MCHC 31.5 - 35.7 g/dL 34.8   RDW 11.6 - 15.4 % 12.7   Platelets 150 - 450 x10E3/uL 187   Neutrophils Not Estab. % 56   Lymphs Not Estab. % 28   Monocytes Not Estab. % 13   Eos Not Estab. % 2   Basos Not Estab. % 1   Neutrophils Absolute 1.4 - 7.0 x10E3/uL 2.4   Lymphocytes Absolute 0.7 - 3.1 x10E3/uL 1.2   Monocytes Absolute 0.1 - 0.9  x10E3/uL 0.5   EOS (ABSOLUTE) 0.0 - 0.4 x10E3/uL 0.1   Basophils Absolute 0.0 - 0.2 x10E3/uL 0.0   Immature Granulocytes Not Estab. % 0   Immature Grans (Abs) 0.0 - 0.1 x10E3/uL 0.0    June 26, 2015  colonoscopy report and images reviewed: No polyps.  10-year follow-up recommended.    Assessment:     Minimally symptomatic right inguinal hernia.    Plan:     Options for management reviewed: One) observation versus two) operative intervention.  Option for referral for laparoscopic/robotic repair discussed.  Indications for prosthetic mesh reviewed.   Patient to be scheduled for a right open inguinal hernia repair for 03-12-20. It is okay for patient to continue an 81 mg aspirin once daily.     Entered by Michele Bailey, CMA, acting as a scribe for Dr. Jeffrey Byrnett, MD.   The documentation recorded by the scribe accurately reflects the service I personally performed and the decisions made by me.   Jeffrey W. Byrnett, MD FACS   

## 2020-03-05 ENCOUNTER — Other Ambulatory Visit: Payer: Self-pay

## 2020-03-05 ENCOUNTER — Encounter
Admission: RE | Admit: 2020-03-05 | Discharge: 2020-03-05 | Disposition: A | Discharge status: Home / Self Care | Payer: Medicare HMO | Source: Ambulatory Visit | Attending: General Surgery | Admitting: General Surgery

## 2020-03-05 HISTORY — DX: Essential (primary) hypertension: I10

## 2020-03-05 HISTORY — DX: Other complications of anesthesia, initial encounter: T88.59XA

## 2020-03-05 HISTORY — DX: Gastro-esophageal reflux disease without esophagitis: K21.9

## 2020-03-05 NOTE — Patient Instructions (Signed)
Your procedure is scheduled on: 03/12/20 Report to [place] after check in at the [facility]. To find out your arrival time please call [phone] between 1PM - 3PM on 03/09/20.  Remember: Instructions that are not followed completely may result in serious medical risk, up to and including death, or upon the discretion of your surgeon and anesthesiologist your surgery may need to be rescheduled.     _X__ 1. Do not eat food after midnight the night before your procedure.                 No gum chewing or hard candies. You may drink clear liquids up to 2 hours                 before you are scheduled to arrive for your surgery- DO not drink clear                 liquids within 2 hours of the start of your surgery.                 Clear Liquids include:  water, apple juice without pulp, clear carbohydrate                 drink such as Clearfast or Gatorade, Black Coffee or Tea (Do not add                 anything to coffee or tea). Diabetics water only  __X__2.  On the morning of surgery brush your teeth with toothpaste and water, you                 may rinse your mouth with mouthwash if you wish.  Do not swallow any              toothpaste of mouthwash.     _X__ 3.  No Alcohol for 24 hours before or after surgery.   _X__ 4.  Do Not Smoke or use e-cigarettes For 24 Hours Prior to Your Surgery.                 Do not use any chewable tobacco products for at least 6 hours prior to                 surgery.  ____  5.  Bring all medications with you on the day of surgery if instructed.   __X__  6.  Notify your doctor if there is any change in your medical condition      (cold, fever, infections).     Do not wear jewelry, make-up, hairpins, clips or nail polish. Do not wear lotions, powders, or perfumes.  Do not shave 48 hours prior to surgery. Men may shave face and neck. Do not bring valuables to the hospital.     [facility] is  not responsible for any belongings or valuables.  Contacts, dentures/partials or body piercings may not be worn into surgery. Bring a case for your contacts, glasses or hearing aids, a denture cup will be supplied. Leave your suitcase in the car. After surgery it may be brought to your room. For patients admitted to the hospital, discharge time is determined by your treatment team.   Patients discharged the day of surgery will not be allowed to drive home.   Please read over the following fact sheets that you were given:   CHG SOAP  __X__ Take these medicines the morning of surgery  with A SIP OF WATER:    1. amLODipine (NORVASC) 5 MG tablet  2. lansoprazole (PREVACID) 30 MG capsule  3.   4.  5.  6.  ____ Fleet Enema (as directed)   __X__ Use CHG Soap/SAGE wipes as directed  ____ Use inhalers on the day of surgery  ____ Stop metformin/Janumet/Farxiga 2 days prior to surgery    ____ Take 1/2 of usual insulin dose the night before surgery. No insulin the morning          of surgery.   ____ Stop Blood Thinners Coumadin/Plavix/Xarelto/Pleta/Pradaxa/Eliquis/Effient/Aspirin  on   Or contact your Surgeon, Cardiologist or Medical Doctor regarding  ability to stop your blood thinners  __X__ Stop Anti-inflammatories 7 days before surgery such as Advil, Ibuprofen, Motrin,  BC or Goodies Powder, Naprosyn, Naproxen, Aleve, Aspirin    __X__ Stop all herbal supplements, fish oil or vitamin E until after surgery.    ____ Bring C-Pap to the hospital.    STOP CRANBERRY, OMEGA 3 FISH OIL, VITAMIN C AND ASPIRIN 7 DAYS PRIOR TO SURGERY

## 2020-03-06 ENCOUNTER — Encounter
Admission: RE | Admit: 2020-03-06 | Discharge: 2020-03-06 | Disposition: A | Discharge status: Home / Self Care | Payer: Medicare HMO | Source: Ambulatory Visit | Attending: General Surgery | Admitting: General Surgery

## 2020-03-06 DIAGNOSIS — Z01810 Encounter for preprocedural cardiovascular examination: Secondary | ICD-10-CM | POA: Insufficient documentation

## 2020-03-06 DIAGNOSIS — I10 Essential (primary) hypertension: Secondary | ICD-10-CM | POA: Diagnosis not present

## 2020-03-08 ENCOUNTER — Other Ambulatory Visit
Admission: RE | Admit: 2020-03-08 | Discharge: 2020-03-08 | Disposition: A | Discharge status: Home / Self Care | Payer: Medicare HMO | Source: Ambulatory Visit | Attending: General Surgery | Admitting: General Surgery

## 2020-03-08 ENCOUNTER — Other Ambulatory Visit: Payer: Self-pay

## 2020-03-08 DIAGNOSIS — Z01812 Encounter for preprocedural laboratory examination: Secondary | ICD-10-CM | POA: Diagnosis not present

## 2020-03-08 DIAGNOSIS — Z20822 Contact with and (suspected) exposure to covid-19: Secondary | ICD-10-CM | POA: Insufficient documentation

## 2020-03-09 LAB — SARS CORONAVIRUS 2 (TAT 6-24 HRS): SARS Coronavirus 2: NEGATIVE

## 2020-03-12 ENCOUNTER — Ambulatory Visit: Payer: Medicare HMO | Admitting: Registered Nurse

## 2020-03-12 ENCOUNTER — Ambulatory Visit
Admission: RE | Admit: 2020-03-12 | Discharge: 2020-03-12 | Disposition: A | Discharge status: Home / Self Care | Payer: Medicare HMO | Attending: General Surgery | Admitting: General Surgery

## 2020-03-12 ENCOUNTER — Encounter
Admission: RE | Disposition: A | Discharge status: Home / Self Care | Payer: Self-pay | Source: Home / Self Care | Attending: General Surgery

## 2020-03-12 ENCOUNTER — Encounter: Payer: Self-pay | Admitting: General Surgery

## 2020-03-12 ENCOUNTER — Other Ambulatory Visit: Payer: Self-pay

## 2020-03-12 DIAGNOSIS — Z79899 Other long term (current) drug therapy: Secondary | ICD-10-CM | POA: Diagnosis not present

## 2020-03-12 DIAGNOSIS — K4090 Unilateral inguinal hernia, without obstruction or gangrene, not specified as recurrent: Secondary | ICD-10-CM | POA: Insufficient documentation

## 2020-03-12 DIAGNOSIS — Z8546 Personal history of malignant neoplasm of prostate: Secondary | ICD-10-CM | POA: Diagnosis not present

## 2020-03-12 DIAGNOSIS — Z7982 Long term (current) use of aspirin: Secondary | ICD-10-CM | POA: Insufficient documentation

## 2020-03-12 DIAGNOSIS — Z87891 Personal history of nicotine dependence: Secondary | ICD-10-CM | POA: Insufficient documentation

## 2020-03-12 HISTORY — PX: INGUINAL HERNIA REPAIR: SHX194

## 2020-03-12 SURGERY — REPAIR, HERNIA, INGUINAL, ADULT
Anesthesia: General | Laterality: Right

## 2020-03-12 MED ORDER — ONDANSETRON HCL 4 MG/2ML IJ SOLN
INTRAMUSCULAR | Status: DC | PRN
  Administered 2020-03-12: 4 mg via INTRAVENOUS

## 2020-03-12 MED ORDER — PROPOFOL 10 MG/ML IV BOLUS
INTRAVENOUS | Status: DC | PRN
  Administered 2020-03-12: 160 mg via INTRAVENOUS

## 2020-03-12 MED ORDER — CHLORHEXIDINE GLUCONATE 0.12 % MT SOLN
OROMUCOSAL | Status: AC
  Administered 2020-03-12: 15 mL via OROMUCOSAL
  Filled 2020-03-12: qty 15

## 2020-03-12 MED ORDER — ACETAMINOPHEN 10 MG/ML IV SOLN
INTRAVENOUS | Status: DC | PRN
  Administered 2020-03-12: 1000 mg via INTRAVENOUS

## 2020-03-12 MED ORDER — BUPIVACAINE-EPINEPHRINE (PF) 0.5% -1:200000 IJ SOLN
INTRAMUSCULAR | Status: DC | PRN
  Administered 2020-03-12: 30 mL

## 2020-03-12 MED ORDER — CHLORHEXIDINE GLUCONATE 0.12 % MT SOLN: 15.0000 mL | Freq: Once | OROMUCOSAL | Status: AC

## 2020-03-12 MED ORDER — LIDOCAINE HCL (CARDIAC) PF 100 MG/5ML IV SOSY
PREFILLED_SYRINGE | INTRAVENOUS | Status: DC | PRN
  Administered 2020-03-12: 100 mg via INTRAVENOUS

## 2020-03-12 MED ORDER — MIDAZOLAM HCL 2 MG/2ML IJ SOLN
INTRAMUSCULAR | Status: DC | PRN
  Administered 2020-03-12: 2 mg via INTRAVENOUS

## 2020-03-12 MED ORDER — ONDANSETRON HCL 4 MG/2ML IJ SOLN: 4.0000 mg | Freq: Once | INTRAMUSCULAR | Status: DC | PRN

## 2020-03-12 MED ORDER — PROPOFOL 10 MG/ML IV BOLUS
INTRAVENOUS | Status: AC
  Filled 2020-03-12: qty 20

## 2020-03-12 MED ORDER — LIDOCAINE HCL (PF) 2 % IJ SOLN
INTRAMUSCULAR | Status: AC
  Filled 2020-03-12: qty 5

## 2020-03-12 MED ORDER — HYDROCODONE-ACETAMINOPHEN 5-325 MG PO TABS
1.0000 | ORAL_TABLET | ORAL | 0 refills | Status: DC | PRN
Start: 2020-03-12 — End: 2020-06-14

## 2020-03-12 MED ORDER — FENTANYL CITRATE (PF) 100 MCG/2ML IJ SOLN
INTRAMUSCULAR | Status: DC | PRN
  Administered 2020-03-12 (×2): 25 ug via INTRAVENOUS

## 2020-03-12 MED ORDER — DEXMEDETOMIDINE (PRECEDEX) IN NS 20 MCG/5ML (4 MCG/ML) IV SYRINGE
PREFILLED_SYRINGE | INTRAVENOUS | Status: DC | PRN
  Administered 2020-03-12: 12 ug via INTRAVENOUS
  Administered 2020-03-12: 8 ug via INTRAVENOUS

## 2020-03-12 MED ORDER — DEXAMETHASONE SODIUM PHOSPHATE 10 MG/ML IJ SOLN
INTRAMUSCULAR | Status: DC | PRN
  Administered 2020-03-12: 10 mg via INTRAVENOUS

## 2020-03-12 MED ORDER — FENTANYL CITRATE (PF) 100 MCG/2ML IJ SOLN: 25.0000 ug | INTRAMUSCULAR | Status: DC | PRN

## 2020-03-12 MED ORDER — BUPIVACAINE-EPINEPHRINE (PF) 0.5% -1:200000 IJ SOLN
INTRAMUSCULAR | Status: AC
  Filled 2020-03-12: qty 90

## 2020-03-12 MED ORDER — GLYCOPYRROLATE 0.2 MG/ML IJ SOLN
INTRAMUSCULAR | Status: AC
  Filled 2020-03-12: qty 1

## 2020-03-12 MED ORDER — GLYCOPYRROLATE 0.2 MG/ML IJ SOLN
INTRAMUSCULAR | Status: DC | PRN
  Administered 2020-03-12: .2 mg via INTRAVENOUS

## 2020-03-12 MED ORDER — ONDANSETRON HCL 4 MG/2ML IJ SOLN
INTRAMUSCULAR | Status: AC
  Filled 2020-03-12: qty 2

## 2020-03-12 MED ORDER — CEFAZOLIN SODIUM-DEXTROSE 2-4 GM/100ML-% IV SOLN
INTRAVENOUS | Status: AC
  Filled 2020-03-12: qty 100

## 2020-03-12 MED ORDER — CHLORHEXIDINE GLUCONATE CLOTH 2 % EX PADS
6.0000 | MEDICATED_PAD | Freq: Once | CUTANEOUS | Status: AC
  Administered 2020-03-12: 6 via TOPICAL

## 2020-03-12 MED ORDER — LACTATED RINGERS IV SOLN: INTRAVENOUS | Status: DC

## 2020-03-12 MED ORDER — DEXAMETHASONE SODIUM PHOSPHATE 10 MG/ML IJ SOLN
INTRAMUSCULAR | Status: AC
  Filled 2020-03-12: qty 1

## 2020-03-12 MED ORDER — KETOROLAC TROMETHAMINE 30 MG/ML IJ SOLN
INTRAMUSCULAR | Status: DC | PRN
  Administered 2020-03-12: 30 mg via INTRAVENOUS

## 2020-03-12 MED ORDER — FENTANYL CITRATE (PF) 100 MCG/2ML IJ SOLN
INTRAMUSCULAR | Status: AC
  Filled 2020-03-12: qty 2

## 2020-03-12 MED ORDER — DEXMEDETOMIDINE (PRECEDEX) IN NS 20 MCG/5ML (4 MCG/ML) IV SYRINGE
PREFILLED_SYRINGE | INTRAVENOUS | Status: AC
  Filled 2020-03-12: qty 5

## 2020-03-12 MED ORDER — EPHEDRINE SULFATE 50 MG/ML IJ SOLN
INTRAMUSCULAR | Status: DC | PRN
  Administered 2020-03-12 (×3): 10 mg via INTRAVENOUS

## 2020-03-12 MED ORDER — ACETAMINOPHEN 10 MG/ML IV SOLN
INTRAVENOUS | Status: AC
  Filled 2020-03-12: qty 100

## 2020-03-12 MED ORDER — KETOROLAC TROMETHAMINE 30 MG/ML IJ SOLN
INTRAMUSCULAR | Status: AC
  Filled 2020-03-12: qty 1

## 2020-03-12 MED ORDER — MIDAZOLAM HCL 2 MG/2ML IJ SOLN
INTRAMUSCULAR | Status: AC
  Filled 2020-03-12: qty 2

## 2020-03-12 MED ORDER — ORAL CARE MOUTH RINSE: 15.0000 mL | Freq: Once | OROMUCOSAL | Status: AC

## 2020-03-12 MED ORDER — CEFAZOLIN SODIUM-DEXTROSE 2-4 GM/100ML-% IV SOLN
2.0000 g | INTRAVENOUS | Status: AC
  Administered 2020-03-12: 2 g via INTRAVENOUS

## 2020-03-12 SURGICAL SUPPLY — 39 items
APL PRP STRL LF DISP 70% ISPRP (MISCELLANEOUS) ×1
BLADE SURG 15 STRL SS SAFETY (BLADE) ×4 IMPLANT
CANISTER SUCT 1200ML W/VALVE (MISCELLANEOUS) ×2 IMPLANT
CHLORAPREP W/TINT 26 (MISCELLANEOUS) ×2 IMPLANT
COVER WAND RF STERILE (DRAPES) ×2 IMPLANT
DECANTER SPIKE VIAL GLASS SM (MISCELLANEOUS) IMPLANT
DRAIN PENROSE 1/4X12 LTX STRL (WOUND CARE) ×2 IMPLANT
DRAPE LAPAROTOMY 100X77 ABD (DRAPES) ×2 IMPLANT
DRSG TEGADERM 4X4.75 (GAUZE/BANDAGES/DRESSINGS) ×2 IMPLANT
DRSG TELFA 4X3 1S NADH ST (GAUZE/BANDAGES/DRESSINGS) ×2 IMPLANT
ELECT REM PT RETURN 9FT ADLT (ELECTROSURGICAL) ×2
ELECTRODE REM PT RTRN 9FT ADLT (ELECTROSURGICAL) ×1 IMPLANT
GLOVE INDICATOR 8.0 STRL GRN (GLOVE) ×2 IMPLANT
GLOVE SURG ENC MOIS LTX SZ7.5 (GLOVE) ×4 IMPLANT
GOWN STRL REUS W/ TWL LRG LVL3 (GOWN DISPOSABLE) ×2 IMPLANT
GOWN STRL REUS W/TWL LRG LVL3 (GOWN DISPOSABLE) ×4
KIT TURNOVER KIT A (KITS) ×2 IMPLANT
LABEL OR SOLS (LABEL) ×2 IMPLANT
MANIFOLD NEPTUNE II (INSTRUMENTS) ×2 IMPLANT
MESH HERNIA 6X12 ULTRAPRO MED (Mesh General) ×1 IMPLANT
MESH HERNIA PLUG LRG (Mesh General) ×1 IMPLANT
MESH HERNIA SYS ULTRAPRO LRG (Mesh General) ×1 IMPLANT
MESH HERNIA ULTRAPRO MED (Mesh General) ×1 IMPLANT
MESH MARLEX PLUG MEDIUM (Mesh General) ×1 IMPLANT
NEEDLE HYPO 22GX1.5 SAFETY (NEEDLE) ×4 IMPLANT
PACK BASIN MINOR ARMC (MISCELLANEOUS) ×2 IMPLANT
STRIP CLOSURE SKIN 1/2X4 (GAUZE/BANDAGES/DRESSINGS) ×2 IMPLANT
SUT PDS AB 0 CT1 27 (SUTURE) IMPLANT
SUT SURGILON 0 BLK (SUTURE) ×2 IMPLANT
SUT VIC AB 2-0 SH 27 (SUTURE) ×2
SUT VIC AB 2-0 SH 27XBRD (SUTURE) ×1 IMPLANT
SUT VIC AB 3-0 54X BRD REEL (SUTURE) ×1 IMPLANT
SUT VIC AB 3-0 BRD 54 (SUTURE) ×2
SUT VIC AB 3-0 SH 27 (SUTURE) ×2
SUT VIC AB 3-0 SH 27X BRD (SUTURE) ×1 IMPLANT
SUT VIC AB 4-0 FS2 27 (SUTURE) ×2 IMPLANT
SWABSTK COMLB BENZOIN TINCTURE (MISCELLANEOUS) ×2 IMPLANT
SYR 10ML LL (SYRINGE) ×2 IMPLANT
SYR 3ML LL SCALE MARK (SYRINGE) ×2 IMPLANT

## 2020-03-12 NOTE — OR Nursing (Signed)
Dr. Bary Castilla in to see pt in postop @ 1326.

## 2020-03-12 NOTE — Anesthesia Postprocedure Evaluation (Signed)
Anesthesia Post Note  Patient: Cody Rush  Procedure(s) Performed: HERNIA REPAIR INGUINAL ADULT (Right )  Patient location during evaluation: PACU Anesthesia Type: General Level of consciousness: awake and alert Pain management: pain level controlled Vital Signs Assessment: post-procedure vital signs reviewed and stable Respiratory status: spontaneous breathing and respiratory function stable Cardiovascular status: stable Anesthetic complications: no   No complications documented.   Last Vitals:  Vitals:   03/12/20 1306 03/12/20 1341  BP: 126/85 119/82  Pulse: 68 68  Resp: 16 16  Temp: (!) 36.4 C   SpO2: 98% 98%    Last Pain:  Vitals:   03/12/20 1341  TempSrc:   PainSc: 0-No pain                 Kelvin Sennett K

## 2020-03-12 NOTE — Transfer of Care (Signed)
Immediate Anesthesia Transfer of Care Note  Patient: CABELL LAZENBY  Procedure(s) Performed: Procedure(s): HERNIA REPAIR INGUINAL ADULT (Right)  Patient Location: PACU  Anesthesia Type:General  Level of Consciousness: sedated  Airway & Oxygen Therapy: Patient Spontanous Breathing and Patient connected to face mask oxygen  Post-op Assessment: Report given to RN and Post -op Vital signs reviewed and stable  Post vital signs: Reviewed and stable  Last Vitals:  Vitals:   03/12/20 0949 03/12/20 1230  BP: (!) 162/103 102/66  Pulse: 88 65  Resp: 18 14  Temp: 36.6 C (!) 36.3 C  SpO2: 44% 92%    Complications: No apparent anesthesia complications

## 2020-03-12 NOTE — Discharge Instructions (Addendum)
AMBULATORY SURGERY  DISCHARGE INSTRUCTIONS   1) The drugs that you were given will stay in your system until tomorrow so for the next 24 hours you should not:  A) Drive an automobile B) Make any legal decisions C) Drink any alcoholic beverage   2) You may resume regular meals tomorrow.  Today it is better to start with liquids and gradually work up to solid foods.  You may eat anything you prefer, but it is better to start with liquids, then soup and crackers, and gradually work up to solid foods.   3) Please notify your doctor immediately if you have any unusual bleeding, trouble breathing, redness and pain at the surgery site, drainage, fever, or pain not relieved by medication.    4) Additional Instructions:     TYLENOL 1,000 MG GIVEN AT 12:02 PM; TORADOL (ANTI-INFLAMMATORY) GIVEN AT 12:13 PM.        Please contact your physician with any problems or Same Day Surgery at [phone], Monday through Friday 6 am to 4 pm, or [place] [facility] number at [phone].

## 2020-03-12 NOTE — Anesthesia Preprocedure Evaluation (Addendum)
Anesthesia Evaluation  Patient identified by MRN, date of birth, ID band Patient awake    Reviewed: Allergy & Precautions, NPO status , Patient's Chart, lab work & pertinent test results  History of Anesthesia Complications Negative for: history of anesthetic complications  Airway Mallampati: II       Dental   Pulmonary neg sleep apnea, neg COPD, Not current smoker,           Cardiovascular hypertension, Pt. on medications (-) Past MI and (-) CHF (-) dysrhythmias (-) Valvular Problems/Murmurs     Neuro/Psych neg Seizures    GI/Hepatic Neg liver ROS, GERD  Medicated and Controlled,  Endo/Other  neg diabetes  Renal/GU negative Renal ROS     Musculoskeletal   Abdominal   Peds  Hematology   Anesthesia Other Findings   Reproductive/Obstetrics                            Anesthesia Physical Anesthesia Plan  ASA: II  Anesthesia Plan: General   Post-op Pain Management:    Induction: Intravenous  PONV Risk Score and Plan: 2 and Ondansetron and Dexamethasone  Airway Management Planned: LMA  Additional Equipment:   Intra-op Plan:   Post-operative Plan:   Informed Consent: I have reviewed the patients History and Physical, chart, labs and discussed the procedure including the risks, benefits and alternatives for the proposed anesthesia with the patient or authorized representative who has indicated his/her understanding and acceptance.       Plan Discussed with:   Anesthesia Plan Comments:         Anesthesia Quick Evaluation

## 2020-03-12 NOTE — Op Note (Signed)
Preoperative diagnosis: Right inguinal hernia.  Postoperative diagnosis: Same direct and indirect.  Operative procedure: Right inguinal hernia repair with medium Ultra Pro mesh.  Operating surgeon: Hervey Ard, MD.  Anesthesia: General by LMA, Marcaine 0.5% with 1: 200,000 units of epinephrine, 30 cc; Toradol: 30 mg.  Estimated blood loss: Less than 5 cc.  Clinical note: This 65 year old male has developed a right inguinal hernia that is present is mildly symptomatic.  He was admitted for elective repair.  He received Ancef prior to the procedure for antibiotic prophylaxis.  SCD stockings for DVT prevention.  Hair was removed from the surgical site with clippers prior to presentation to the operating theater.  Operative note: With the patient under adequate general anesthesia the area was cleansed with ChloraPrep and draped.  Field block anesthesia was established and a 5 cm skin line incision along the anticipated course of the inguinal canal was made.  This was done sharply.  Hemostasis was with 3-0 Vicryl ties and electrocautery.  The external oblique was opened in the direction of its fibers.  The ilioinguinal and iliohypogastric nerves were found to be running fairly close to each other.  These were identified and protected.  The patient was noted to have a 1 cm fascial direct defect and then a broader-based indirect defect.  These 2 areas were freed circumferentially and the intervening bit of thin posterior floor was divided.  The preperitoneal space was swept clear and a medium Ultra Pro mesh smoothed into position.  The external component was anchored to the pubic tubercle and then along the inguinal ligament with interrupted 0 Surgilon sutures.  The medial and superior borders were anchored to the transverse abdominis aponeurosis.  A lateral slit was made for cord passage and closed.  Toradol was placed into the wound.  The external oblique was closed with a running 2-0 Vicryl suture.   Scarpa's fascia closed with a running 3-0 Vicryl suture.  The skin closed with a running 4-0 Vicryl subcuticular suture.  Benzoin, Steri-Strips, Telfa and Tegaderm dressing was then applied.  Patient tolerated the procedure well and was taken to recovery in stable condition.

## 2020-03-12 NOTE — H&P (Signed)
Cody Rush [id number] 1955/10/21     HPI:  Patient with mildly symptomatic right inguinal hernia for repair with prosthetic mesh.   Medications Prior to Admission  Medication Sig Dispense Refill Last Dose  . amLODipine (NORVASC) 5 MG tablet Take 1 tablet (5 mg total) by mouth daily. 90 tablet 3 03/12/2020 at Unknown time  . aspirin 81 MG tablet Take 81 mg by mouth every other day.   Past Week at Unknown time  . CRANBERRY PO Take 4,200 mg by mouth daily.   Past Week at Unknown time  . lansoprazole (PREVACID) 30 MG capsule TAKE 1 CAPSULE DAILY (Patient taking differently: Take 30 mg by mouth daily.) 90 capsule 3 03/12/2020 at Unknown time  . Multiple Vitamins tablet Take 1 tablet by mouth daily.    Past Week at Unknown time  . Omega-3 Fatty Acids (FISH OIL) 1200 MG CAPS Take 1,200 mg by mouth daily.   Past Week at Unknown time  . tadalafil (CIALIS) 20 MG tablet Take 1 tablet every 3 days as needed. (Patient taking differently: Take 20 mg by mouth daily as needed for erectile dysfunction.) 30 tablet 5   . vitamin C (ASCORBIC ACID) 500 MG tablet Take 500 mg by mouth daily.   Past Week at Unknown time  . Vitamin D3 (VITAMIN D) 25 MCG tablet Take 1,000 Units by mouth daily.   Past Week at Unknown time  . hydrocortisone (PROCTOZONE-HC) 2.5 % rectal cream Place 1 application rectally 2 (two) times daily. (Patient not taking: Reported on 03/12/2020) 28 g 0 Not Taking at Unknown time  . sildenafil (REVATIO) 20 MG tablet 1 to 4 tablets daily as needed (Patient not taking: No sig reported) 50 tablet 5 Not Taking at Unknown time   No Known Allergies Past Medical History:  Diagnosis Date  . Complication of anesthesia    bradycardia/hypotension  . GERD (gastroesophageal reflux disease)   . Hypertension   . Prostate cancer [facility])    Past Surgical History:  Procedure Laterality Date  . FRACTURE SURGERY     left leg  . KNEE SURGERY    . LITHOTRIPSY    . THORACOSCOPY  11/2015   Social History    Socioeconomic History  . Marital status: Single    Spouse name: Not on file  . Number of children: Not on file  . Years of education: Not on file  . Highest education level: Not on file  Occupational History  . Not on file  Tobacco Use  . Smoking status: Never Smoker  . Smokeless tobacco: Former Systems developer    Types: Secondary school teacher  . Vaping Use: Never used  Substance and Sexual Activity  . Alcohol use: No    Alcohol/week: 0.0 standard drinks  . Drug use: No  . Sexual activity: Yes    Birth control/protection: None  Other Topics Concern  . Not on file  Social History Narrative  . Not on file   Social Determinants of Health   Financial Resource Strain: Not on file  Food Insecurity: Not on file  Transportation Needs: Not on file  Physical Activity: Not on file  Stress: Not on file  Social Connections: Not on file  Intimate Partner Violence: Not on file   Social History   Social History Narrative  . Not on file     ROS: Negative.     PE: HEENT: Negative. Lungs: Clear. Cardio: RR.  Assessment/Plan:  Proceed with planned right inguinal hernia repair.  Forest Gleason [facility] 03/12/2020

## 2020-03-12 NOTE — Anesthesia Procedure Notes (Signed)
Procedure Name: LMA Insertion Date/Time: 03/12/2020 11:37 AM Performed by: Doreen Salvage, CRNA Pre-anesthesia Checklist: Patient identified, Patient being monitored, Timeout performed, Emergency Drugs available and Suction available Patient Re-evaluated:Patient Re-evaluated prior to induction Oxygen Delivery Method: Circle system utilized Preoxygenation: Pre-oxygenation with 100% oxygen Induction Type: IV induction Ventilation: Mask ventilation without difficulty LMA: LMA inserted LMA Size: 4.0 Tube type: Oral Number of attempts: 1 Placement Confirmation: positive ETCO2 and breath sounds checked- equal and bilateral Tube secured with: Tape Dental Injury: Teeth and Oropharynx as per pre-operative assessment

## 2020-04-06 DIAGNOSIS — I10 Essential (primary) hypertension: Secondary | ICD-10-CM | POA: Diagnosis not present

## 2020-04-06 DIAGNOSIS — Z803 Family history of malignant neoplasm of breast: Secondary | ICD-10-CM | POA: Diagnosis not present

## 2020-04-06 DIAGNOSIS — N529 Male erectile dysfunction, unspecified: Secondary | ICD-10-CM | POA: Diagnosis not present

## 2020-04-06 DIAGNOSIS — Z8249 Family history of ischemic heart disease and other diseases of the circulatory system: Secondary | ICD-10-CM | POA: Diagnosis not present

## 2020-04-06 DIAGNOSIS — Z87891 Personal history of nicotine dependence: Secondary | ICD-10-CM | POA: Diagnosis not present

## 2020-04-06 DIAGNOSIS — K219 Gastro-esophageal reflux disease without esophagitis: Secondary | ICD-10-CM | POA: Diagnosis not present

## 2020-06-08 ENCOUNTER — Other Ambulatory Visit: Payer: Medicare HMO

## 2020-06-08 ENCOUNTER — Other Ambulatory Visit: Payer: Self-pay

## 2020-06-08 DIAGNOSIS — C61 Malignant neoplasm of prostate: Secondary | ICD-10-CM | POA: Diagnosis not present

## 2020-06-09 LAB — FPSA% REFLEX
% FREE PSA: 21.9 %
PSA, FREE: 1.05 ng/mL

## 2020-06-09 LAB — PSA TOTAL (REFLEX TO FREE): Prostate Specific Ag, Serum: 4.8 ng/mL — ABNORMAL HIGH (ref 0.0–4.0)

## 2020-06-14 ENCOUNTER — Encounter: Payer: Self-pay | Admitting: Urology

## 2020-06-14 ENCOUNTER — Other Ambulatory Visit: Payer: Self-pay

## 2020-06-14 ENCOUNTER — Ambulatory Visit: Payer: Medicare HMO | Admitting: Urology

## 2020-06-14 VITALS — BP 125/77 | HR 70 | Ht 67.0 in | Wt 175.2 lb

## 2020-06-14 DIAGNOSIS — N529 Male erectile dysfunction, unspecified: Secondary | ICD-10-CM | POA: Diagnosis not present

## 2020-06-14 DIAGNOSIS — C61 Malignant neoplasm of prostate: Secondary | ICD-10-CM

## 2020-06-14 NOTE — Progress Notes (Signed)
   06/14/2020 3:54 PM   [name] 08/24/1955 [id number]  Reason for visit: Follow up prostate cancer  HPI: I saw Cody Rush in urology clinic for active surveillance for very low risk prostate cancer. Briefly, he is a healthy 65 year old male who presented with a elevated PSA of 4.6 with 20% free in November 2020.  Prostate biopsy at that time showed a 52 g gland with a PSA density of 0.09, 2/12 cores positive for gleason score 3+3=6 prostate cancer with 7% max core involvement.  This placed him in the very low risk category per the AUA guidelines.  He underwent a confirmatory biopsy on 09/01/2019 showing a 53 g prostate with PSA density of 0.08, and 0/12 cores showed prostate cancer.   This was very reassuring and suggests that he has a very low volume of very low risk prostate cancer, and he is an excellent candidate to continue active surveillance.  We discussed the extremely low, but nonzero, risk of developing metastatic disease while on active surveillance.  We discussed the need for ongoing PSA monitoring and DRE every 6 to 12 months, and consideration of repeat biopsy every 3 to 5 years unless rising PSA prompted biopsy sooner.  We also discussed the controversial role of MRI in active surveillance if he had a significant rise in the PSA.  PSA is stable this year at 4.8 from 4.6 at time of diagnosis  He takes Cialis as needed for ED with good results.  He denies any significant urinary symptoms.  Continue active surveillance for very low risk prostate cancer, RTC 1 year with [place], MD  [place] [address] [place], [place] [address] [phone]

## 2020-07-03 ENCOUNTER — Other Ambulatory Visit: Payer: Self-pay | Admitting: Family Medicine

## 2020-07-03 MED ORDER — LANSOPRAZOLE 30 MG PO CPDR: 30.0000 mg | DELAYED_RELEASE_CAPSULE | Freq: Every day | ORAL | 0 refills | Status: DC

## 2020-07-03 NOTE — Telephone Encounter (Signed)
Medication Refill - Medication: lansoprazole (PREVACID) 30 MG capsule  Pt is no longer using [facility] and needs a new Rx sent to [facility]   Has the patient contacted their pharmacy? Yes (Agent: If no, request that the patient contact the pharmacy for the refill.) (Agent: If yes, when and what did the pharmacy advise?)call pcp   Preferred Pharmacy (with phone number or street name):  [facility] [id number] Cody Rush, Cody Rush Phone:  [phone]  Fax:  [phone]     Agent: Please be advised that RX refills may take up to 3 business days. We ask that you follow-up with your pharmacy.

## 2020-08-14 ENCOUNTER — Encounter: Payer: Self-pay | Admitting: Family Medicine

## 2020-08-22 NOTE — Progress Notes (Signed)
I,Cody Rush,acting as a scribe for Cody Durie, MD.,have documented all relevant documentation on the behalf of Cody Durie, MD,as directed by  Cody Durie, MD while in the presence of Cody Durie, MD.     Complete physical exam   Patient: Cody Rush   DOB: September 27, 1955   65 y.o. Male  MRN: [id number] Visit Date: 08/23/2020  Today's healthcare provider: [name], MD   Chief Complaint  Patient presents with   Annual Exam   Subjective    Cody Rush is a 65 y.o. male who presents today for a complete physical exam.  He reports consuming a general diet. Home exercise routine includes walking and bike riding. He generally feels fairly well. He reports sleeping fairly well. He does not have additional problems to discuss today.  HPI  He does have a puncture wound to his left thumb which is not infected. PSA is followed by urology. He does notice worse varicose veins as he is older left greater than right leg.   Past Medical History:  Diagnosis Date   Complication of anesthesia    bradycardia/hypotension   GERD (gastroesophageal reflux disease)    Hypertension    Prostate cancer [facility])    Past Surgical History:  Procedure Laterality Date   FRACTURE SURGERY     left leg   INGUINAL HERNIA REPAIR Right 03/12/2020   Procedure: HERNIA REPAIR INGUINAL ADULT;  Surgeon: Robert Bellow, MD;  Location: [facility] ORS;  Service: General;  Laterality: Right;   KNEE SURGERY     LITHOTRIPSY     THORACOSCOPY  11/2015   Social History   Socioeconomic History   Marital status: Single    Spouse name: Not on file   Number of children: Not on file   Years of education: Not on file   Highest education level: Not on file  Occupational History   Not on file  Tobacco Use   Smoking status: Never   Smokeless tobacco: Former    Types: Nurse, children's Use: Never used  Substance and Sexual Activity   Alcohol use: No     Alcohol/week: 0.0 standard drinks   Drug use: No   Sexual activity: Yes    Birth control/protection: None  Other Topics Concern   Not on file  Social History Narrative   Not on file   Social Determinants of Health   Financial Resource Strain: Not on file  Food Insecurity: Not on file  Transportation Needs: Not on file  Physical Activity: Not on file  Stress: Not on file  Social Connections: Not on file  Intimate Partner Violence: Not on file   Family Status  Relation Name Status   Mother  Deceased at age 20   Father  Deceased at age 71   Sister  Alive   Sister  Alive   Family History  Problem Relation Age of Onset   Breast cancer Mother    Colon cancer Father    Stroke Father    Heart disease Father    No Known Allergies  Patient Care Team: Jerrol Banana., MD as PCP - General (Family Medicine)   Medications: Outpatient Medications Prior to Visit  Medication Sig   amLODipine (NORVASC) 5 MG tablet Take 1 tablet (5 mg total) by mouth daily.   aspirin 81 MG tablet Take 81 mg by mouth every other day.   CRANBERRY PO Take 4,200 mg by  mouth daily.   lansoprazole (PREVACID) 30 MG capsule Take 1 capsule (30 mg total) by mouth daily.   Multiple Vitamins tablet Take 1 tablet by mouth daily.    tadalafil (CIALIS) 20 MG tablet Take 1 tablet every 3 days as needed.   vitamin C (ASCORBIC ACID) 500 MG tablet Take 500 mg by mouth daily.   Vitamin D3 (VITAMIN D) 25 MCG tablet Take 1,000 Units by mouth daily.   No facility-administered medications prior to visit.    Review of Systems  Constitutional:  Negative for appetite change, chills, fatigue and fever.  HENT:  Negative for congestion, ear pain, hearing loss, nosebleeds and trouble swallowing.   Eyes:  Negative for pain and visual disturbance.  Respiratory:  Negative for cough, chest tightness and shortness of breath.   Cardiovascular:  Negative for chest pain, palpitations and leg swelling.  Gastrointestinal:   Negative for abdominal pain, blood in stool, constipation, diarrhea, nausea and vomiting.  Endocrine: Negative for polydipsia, polyphagia and polyuria.  Genitourinary:  Negative for dysuria and flank pain.  Musculoskeletal:  Negative for arthralgias, back pain, joint swelling, myalgias and neck stiffness.  Skin:  Negative for color change, rash and wound.  Neurological:  Negative for dizziness, tremors, seizures, speech difficulty, weakness, light-headedness and headaches.  Psychiatric/Behavioral:  Negative for behavioral problems, confusion, decreased concentration, dysphoric mood and sleep disturbance. The patient is not nervous/anxious.   All other systems reviewed and are negative.     Objective    BP 129/85 (BP Location: Left Arm, Patient Position: Sitting, Cuff Size: Normal)   Pulse 68   Temp 98.2 F (36.8 C) (Temporal)   Resp 16   Ht '5\' 7"'$  (1.702 m)   Wt 179 lb (81.2 kg)   BMI 28.04 kg/m  BP Readings from Last 3 Encounters:  08/23/20 129/85  06/14/20 125/77  03/12/20 119/82   Wt Readings from Last 3 Encounters:  08/23/20 179 lb (81.2 kg)  06/14/20 175 lb 3.2 oz (79.5 kg)  03/12/20 172 lb (78 kg)      Physical Exam Vitals reviewed.  Constitutional:      Appearance: He is well-developed.  HENT:     Head: Normocephalic and atraumatic.     Right Ear: External ear normal.     Left Ear: External ear normal.     Nose: Nose normal.  Eyes:     Pupils: Pupils are equal, round, and reactive to light.  Cardiovascular:     Rate and Rhythm: Normal rate. Rhythm irregular.     Heart sounds: Normal heart sounds.     Comments: Left leg greater than right leg varicose veins which are fairly large Pulmonary:     Effort: Pulmonary effort is normal.     Breath sounds: Normal breath sounds.  Abdominal:     General: Bowel sounds are normal.     Palpations: Abdomen is soft.  Genitourinary:    Penis: Normal.      Testes: Normal.  Musculoskeletal:        General: Normal range of  motion.     Cervical back: Normal range of motion and neck supple.  Skin:    General: Skin is warm and dry.  Neurological:     General: No focal deficit present.     Mental Status: He is alert and oriented to person, place, and time.  Psychiatric:        Mood and Affect: Mood normal.        Behavior: Behavior normal.  Thought Content: Thought content normal.        Judgment: Judgment normal.    ECG reveals occasional PAC with heart rate of about 70.  Complete right bundle branch block.  Last depression screening scores PHQ 2/9 Scores 08/23/2020 08/11/2019 07/20/2017  PHQ - 2 Score 0 0 0  PHQ- 9 Score 0 1 -   Last fall risk screening Fall Risk  08/23/2020  Falls in the past year? 0  Number falls in past yr: 0  Injury with Fall? 0  Risk for fall due to : No Fall Risks  Follow up Falls evaluation completed   Last Audit-C alcohol use screening Alcohol Use Disorder Test (AUDIT) 08/23/2020  1. How often do you have a drink containing alcohol? 0  2. How many drinks containing alcohol do you have on a typical day when you are drinking? 0  3. How often do you have six or more drinks on one occasion? 0  AUDIT-C Score 0   A score of 3 or more in women, and 4 or more in men indicates increased risk for alcohol abuse, EXCEPT if all of the points are from question 1   No results found for any visits on 08/23/20.  Assessment & Plan    Routine Health Maintenance and Physical Exam  Exercise Activities and Dietary recommendations  Goals   None     Immunization History  Administered Date(s) Administered   Fluad Quad(high Dose 65+) 10/10/2019   Influenza Whole 12/14/2009   Influenza,inj,Quad PF,6+ Mos 11/20/2015   PFIZER Comirnaty(Gray Top)Covid-19 Tri-Sucrose Vaccine 12/23/2019   PFIZER(Purple Top)SARS-COV-2 Vaccination 03/14/2019, 04/04/2019   PNEUMOCOCCAL CONJUGATE-20 08/23/2020   Td 12/14/2009, 08/23/2020   Tdap 03/08/2008   Zoster, Live 05/03/2015    Health Maintenance   Topic Date Due   Zoster Vaccines- Shingrix (1 of 2) Never done   PNA vac Low Risk Adult (1 of 2 - PCV13) 12/05/2018   COVID-19 Vaccine (4 - Booster for Pfizer series) 03/22/2020   INFLUENZA VACCINE  08/13/2020   COLONOSCOPY (Pts 45-42yr Insurance coverage will need to be confirmed)  06/25/2025   TETANUS/TDAP  08/24/2030   Hepatitis C Screening  Completed   HPV VACCINES  Aged Out    Discussed health benefits of physical activity, and encouraged him to engage in regular exercise appropriate for his age and condition.  1. Annual physical exam  - CBC with Differential/Platelet - TSH  2. Primary hypertension  - CBC with Differential/Platelet - Comprehensive metabolic panel - EKG [id number] 3. Other hyperlipidemia  - CBC with Differential/Platelet - Lipid panel  4. Need for vaccination against Streptococcus pneumoniae   5. Need for pneumococcal vaccination  - Pneumococcal conjugate vaccine 20-valent (PCV20) (Preferred)  6. Puncture wound On left thumb. Will update tetanus vaccine.  - Td : Tetanus/diphtheria >7yo Preservative  free  7.  Premature atrial contractions Irregular heart rate confirmed to be benign home EKG Return in about 1 year (around 08/23/2021) for CPE.     I, RWilhemena Durie MD, have reviewed all documentation for this visit. The documentation on 09/02/20 for the exam, diagnosis, procedures, and orders are all accurate and complete.    Fany Cavanaugh GCranford Mon MD  [phone](phone) [phone](fax)  [place]

## 2020-08-23 ENCOUNTER — Ambulatory Visit (INDEPENDENT_AMBULATORY_CARE_PROVIDER_SITE_OTHER): Payer: Medicare HMO | Admitting: Family Medicine

## 2020-08-23 ENCOUNTER — Encounter: Payer: Self-pay | Admitting: Family Medicine

## 2020-08-23 ENCOUNTER — Other Ambulatory Visit: Payer: Self-pay

## 2020-08-23 VITALS — BP 129/85 | HR 68 | Temp 98.2°F | Resp 16 | Ht 67.0 in | Wt 179.0 lb

## 2020-08-23 DIAGNOSIS — Z0000 Encounter for general adult medical examination without abnormal findings: Secondary | ICD-10-CM | POA: Diagnosis not present

## 2020-08-23 DIAGNOSIS — T148XXA Other injury of unspecified body region, initial encounter: Secondary | ICD-10-CM

## 2020-08-23 DIAGNOSIS — I491 Atrial premature depolarization: Secondary | ICD-10-CM

## 2020-08-23 DIAGNOSIS — I10 Essential (primary) hypertension: Secondary | ICD-10-CM | POA: Diagnosis not present

## 2020-08-23 DIAGNOSIS — E7849 Other hyperlipidemia: Secondary | ICD-10-CM | POA: Diagnosis not present

## 2020-08-23 DIAGNOSIS — Z23 Encounter for immunization: Secondary | ICD-10-CM

## 2020-08-23 NOTE — Patient Instructions (Signed)
Get knee high support hose

## 2020-08-24 DIAGNOSIS — E7849 Other hyperlipidemia: Secondary | ICD-10-CM | POA: Diagnosis not present

## 2020-08-24 DIAGNOSIS — I10 Essential (primary) hypertension: Secondary | ICD-10-CM | POA: Diagnosis not present

## 2020-08-24 DIAGNOSIS — Z0000 Encounter for general adult medical examination without abnormal findings: Secondary | ICD-10-CM | POA: Diagnosis not present

## 2020-08-25 LAB — CBC WITH DIFFERENTIAL/PLATELET
Basophils Absolute: 0 10*3/uL (ref 0.0–0.2)
Basos: 1 %
EOS (ABSOLUTE): 0.1 10*3/uL (ref 0.0–0.4)
Eos: 2 %
Hematocrit: 45 % (ref 37.5–51.0)
Hemoglobin: 15.5 g/dL (ref 13.0–17.7)
Immature Grans (Abs): 0 10*3/uL (ref 0.0–0.1)
Immature Granulocytes: 0 %
Lymphocytes Absolute: 0.9 10*3/uL (ref 0.7–3.1)
Lymphs: 16 %
MCH: 30.8 pg (ref 26.6–33.0)
MCHC: 34.4 g/dL (ref 31.5–35.7)
MCV: 90 fL (ref 79–97)
Monocytes Absolute: 0.8 10*3/uL (ref 0.1–0.9)
Monocytes: 14 %
Neutrophils Absolute: 3.8 10*3/uL (ref 1.4–7.0)
Neutrophils: 67 %
Platelets: 186 10*3/uL (ref 150–450)
RBC: 5.03 x10E6/uL (ref 4.14–5.80)
RDW: 12.7 % (ref 11.6–15.4)
WBC: 5.7 10*3/uL (ref 3.4–10.8)

## 2020-08-25 LAB — COMPREHENSIVE METABOLIC PANEL
ALT: 33 IU/L (ref 0–44)
AST: 36 IU/L (ref 0–40)
Albumin/Globulin Ratio: 1.8 (ref 1.2–2.2)
Albumin: 4.3 g/dL (ref 3.8–4.8)
Alkaline Phosphatase: 72 IU/L (ref 44–121)
BUN/Creatinine Ratio: 15 (ref 10–24)
BUN: 18 mg/dL (ref 8–27)
Bilirubin Total: 1.1 mg/dL (ref 0.0–1.2)
CO2: 20 mmol/L (ref 20–29)
Calcium: 9.3 mg/dL (ref 8.6–10.2)
Chloride: 103 mmol/L (ref 96–106)
Creatinine, Ser: 1.21 mg/dL (ref 0.76–1.27)
Globulin, Total: 2.4 g/dL (ref 1.5–4.5)
Glucose: 93 mg/dL (ref 65–99)
Potassium: 4.5 mmol/L (ref 3.5–5.2)
Sodium: 140 mmol/L (ref 134–144)
Total Protein: 6.7 g/dL (ref 6.0–8.5)
eGFR: 66 mL/min/{1.73_m2} (ref >59)

## 2020-08-25 LAB — LIPID PANEL
Chol/HDL Ratio: 4.1 ratio (ref 0.0–5.0)
Cholesterol, Total: 156 mg/dL (ref 100–199)
HDL: 38 mg/dL — ABNORMAL LOW (ref >39)
LDL Chol Calc (NIH): 101 mg/dL — ABNORMAL HIGH (ref 0–99)
Triglycerides: 88 mg/dL (ref 0–149)
VLDL Cholesterol Cal: 17 mg/dL (ref 5–40)

## 2020-08-25 LAB — TSH: TSH: 1.78 u[IU]/mL (ref 0.450–4.500)

## 2020-09-28 ENCOUNTER — Other Ambulatory Visit: Payer: Self-pay | Admitting: Family Medicine

## 2020-11-05 ENCOUNTER — Other Ambulatory Visit: Payer: Self-pay | Admitting: Family Medicine

## 2020-11-05 DIAGNOSIS — I10 Essential (primary) hypertension: Secondary | ICD-10-CM

## 2020-11-05 NOTE — Telephone Encounter (Signed)
Requested medications are due for refill today filled for 90 day supply 9/25  Requested medications are on the active medication list yes  Last refill 9/25 90 day supply   Last visit 08/23/20  Future visit scheduled 08/27/21  Notes to clinic requesting early, please assess.  Requested Prescriptions  Pending Prescriptions Disp Refills   amLODipine (NORVASC) 5 MG tablet [Pharmacy Med Name: AMLODIPINE BESYLATE 5MG  TABLETS] 90 tablet 3    Sig: TAKE 1 TABLET(5 MG) BY MOUTH DAILY     Cardiovascular:  Calcium Channel Blockers Passed - 11/05/2020  5:16 PM      Passed - Last BP in normal range    BP Readings from Last 1 Encounters:  08/23/20 129/85          Passed - Valid encounter within last 6 months    Recent Outpatient Visits           2 months ago Annual physical exam   [facility] Jerrol Banana., MD   9 months ago Non-recurrent unilateral inguinal hernia without obstruction or gangrene   [facility] Jerrol Banana., MD   1 year ago Primary hypertension   [facility] Jerrol Banana., MD   1 year ago Annual physical exam   [facility] Jerrol Banana., MD   1 year ago Pruritus ani   [facility] Jerrol Banana., MD       Future Appointments             In 9 months Jerrol Banana., MD [facility] [place]

## 2020-12-28 ENCOUNTER — Other Ambulatory Visit: Payer: Self-pay | Admitting: Family Medicine

## 2021-03-26 ENCOUNTER — Telehealth: Payer: Self-pay | Admitting: Family Medicine

## 2021-03-26 ENCOUNTER — Other Ambulatory Visit: Payer: Self-pay

## 2021-03-26 MED ORDER — LANSOPRAZOLE 30 MG PO CPDR: 30.0000 mg | DELAYED_RELEASE_CAPSULE | Freq: Every day | ORAL | 3 refills | Status: DC

## 2021-03-26 NOTE — Telephone Encounter (Signed)
[facility] faxed refill request for the following medications: ? ?lansoprazole (PREVACID) 30 MG capsule  ? ?Please advise. ? ?

## 2021-04-22 ENCOUNTER — Telehealth: Payer: Self-pay | Admitting: Family Medicine

## 2021-04-22 NOTE — Telephone Encounter (Signed)
Copied from [place] [phone]. Topic: Medicare AWV ?>> Apr 22, 2021 12:14 PM Cher Nakai R wrote: ?Reason for CRM:  ?Left message for patient to call back and schedule Medicare Annual Wellness Visit (AWV) in office.  ? ?If not able to come in the office, please offer to do virtually or by telephone.  ? ?No hx of AWV - AWV-I eligible per [employer] as of 02/13/2021 ? ?Please schedule at anytime with [facility] Health Advisor.  ? ?45 minute appointment ? ?Any questions, please contact me at [phone] ?

## 2021-04-29 ENCOUNTER — Ambulatory Visit: Payer: Medicare HMO

## 2021-05-02 ENCOUNTER — Ambulatory Visit (INDEPENDENT_AMBULATORY_CARE_PROVIDER_SITE_OTHER): Payer: Medicare HMO

## 2021-05-02 VITALS — BP 130/90 | HR 59 | Temp 97.0°F | Ht 66.0 in | Wt 175.1 lb

## 2021-05-02 DIAGNOSIS — Z0000 Encounter for general adult medical examination without abnormal findings: Secondary | ICD-10-CM

## 2021-05-02 NOTE — Patient Instructions (Signed)
Mr. Pottinger , ?Thank you for taking time to come for your Medicare Wellness Visit. I appreciate your ongoing commitment to your health goals. Please review the following plan we discussed and let me know if I can assist you in the future.  ? ?Screening recommendations/referrals: ?Colonoscopy: 06/26/15 ?Recommended yearly ophthalmology/optometry visit for glaucoma screening and checkup ?Recommended yearly dental visit for hygiene and checkup ? ?Vaccinations: ?Influenza vaccine: n/c ?Pneumococcal vaccine: 08/23/20 ?Tdap vaccine: 08/23/20 ?Shingles vaccine: Zostavax 05/03/15   ?Covid-19: 03/14/19, 04/04/19, 12/23/19 ? ?Advanced directives: no ? ?Conditions/risks identified: none ? ?Next appointment: Follow up in one year for your annual wellness visit. 05/06/22 @ 8:45am in person ?Preventive Care 32 Years and Older, Male ?Preventive care refers to lifestyle choices and visits with your health care provider that can promote health and wellness. ?What does preventive care include? ?A yearly physical exam. This is also called an annual well check. ?Dental exams once or twice a year. ?Routine eye exams. Ask your health care provider how often you should have your eyes checked. ?Personal lifestyle choices, including: ?Daily care of your teeth and gums. ?Regular physical activity. ?Eating a healthy diet. ?Avoiding tobacco and drug use. ?Limiting alcohol use. ?Practicing safe sex. ?Taking low doses of aspirin every day. ?Taking vitamin and mineral supplements as recommended by your health care provider. ?What happens during an annual well check? ?The services and screenings done by your health care provider during your annual well check will depend on your age, overall health, lifestyle risk factors, and family history of disease. ?Counseling  ?Your health care provider may ask you questions about your: ?Alcohol use. ?Tobacco use. ?Drug use. ?Emotional well-being. ?Home and relationship well-being. ?Sexual activity. ?Eating  habits. ?History of falls. ?Memory and ability to understand (cognition). ?Work and work Statistician. ?Screening  ?You may have the following tests or measurements: ?Height, weight, and BMI. ?Blood pressure. ?Lipid and cholesterol levels. These may be checked every 5 years, or more frequently if you are over 45 years old. ?Skin check. ?Lung cancer screening. You may have this screening every year starting at age 64 if you have a 30-pack-year history of smoking and currently smoke or have quit within the past 15 years. ?Fecal occult blood test (FOBT) of the stool. You may have this test every year starting at age 12. ?Flexible sigmoidoscopy or colonoscopy. You may have a sigmoidoscopy every 5 years or a colonoscopy every 10 years starting at age 44. ?Prostate cancer screening. Recommendations will vary depending on your family history and other risks. ?Hepatitis C blood test. ?Hepatitis B blood test. ?Sexually transmitted disease (STD) testing. ?Diabetes screening. This is done by checking your blood sugar (glucose) after you have not eaten for a while (fasting). You may have this done every 1-3 years. ?Abdominal aortic aneurysm (AAA) screening. You may need this if you are a current or former smoker. ?Osteoporosis. You may be screened starting at age 76 if you are at high risk. ?Talk with your health care provider about your test results, treatment options, and if necessary, the need for more tests. ?Vaccines  ?Your health care provider may recommend certain vaccines, such as: ?Influenza vaccine. This is recommended every year. ?Tetanus, diphtheria, and acellular pertussis (Tdap, Td) vaccine. You may need a Td booster every 10 years. ?Zoster vaccine. You may need this after age 60. ?Pneumococcal 13-valent conjugate (PCV13) vaccine. One dose is recommended after age 52. ?Pneumococcal polysaccharide (PPSV23) vaccine. One dose is recommended after age 83. ?Talk to your health care provider  about which screenings and  vaccines you need and how often you need them. ?This information is not intended to replace advice given to you by your health care provider. Make sure you discuss any questions you have with your health care provider. ?Document Released: 01/26/2015 Document Revised: 09/19/2015 Document Reviewed: 10/31/2014 ?Elsevier Interactive Patient Education ? 2017 [employer]. ? ?Fall Prevention in the Home ?Falls can cause injuries. They can happen to people of all ages. There are many things you can do to make your home safe and to help prevent falls. ?What can I do on the outside of my home? ?Regularly fix the edges of walkways and driveways and fix any cracks. ?Remove anything that might make you trip as you walk through a door, such as a raised step or threshold. ?Trim any bushes or trees on the path to your home. ?Use bright outdoor lighting. ?Clear any walking paths of anything that might make someone trip, such as rocks or tools. ?Regularly check to see if handrails are loose or broken. Make sure that both sides of any steps have handrails. ?Any raised decks and porches should have guardrails on the edges. ?Have any leaves, snow, or ice cleared regularly. ?Use sand or salt on walking paths during winter. ?Clean up any spills in your garage right away. This includes oil or grease spills. ?What can I do in the bathroom? ?Use night lights. ?Install grab bars by the toilet and in the tub and shower. Do not use towel bars as grab bars. ?Use non-skid mats or decals in the tub or shower. ?If you need to sit down in the shower, use a plastic, non-slip stool. ?Keep the floor dry. Clean up any water that spills on the floor as soon as it happens. ?Remove soap buildup in the tub or shower regularly. ?Attach bath mats securely with double-sided non-slip rug tape. ?Do not have throw rugs and other things on the floor that can make you trip. ?What can I do in the bedroom? ?Use night lights. ?Make sure that you have a light by your  bed that is easy to reach. ?Do not use any sheets or blankets that are too big for your bed. They should not hang down onto the floor. ?Have a firm chair that has side arms. You can use this for support while you get dressed. ?Do not have throw rugs and other things on the floor that can make you trip. ?What can I do in the kitchen? ?Clean up any spills right away. ?Avoid walking on wet floors. ?Keep items that you use a lot in easy-to-reach places. ?If you need to reach something above you, use a strong step stool that has a grab bar. ?Keep electrical cords out of the way. ?Do not use floor polish or wax that makes floors slippery. If you must use wax, use non-skid floor wax. ?Do not have throw rugs and other things on the floor that can make you trip. ?What can I do with my stairs? ?Do not leave any items on the stairs. ?Make sure that there are handrails on both sides of the stairs and use them. Fix handrails that are broken or loose. Make sure that handrails are as long as the stairways. ?Check any carpeting to make sure that it is firmly attached to the stairs. Fix any carpet that is loose or worn. ?Avoid having throw rugs at the top or bottom of the stairs. If you do have throw rugs, attach them to the floor  with carpet tape. ?Make sure that you have a light switch at the top of the stairs and the bottom of the stairs. If you do not have them, ask someone to add them for you. ?What else can I do to help prevent falls? ?Wear shoes that: ?Do not have high heels. ?Have rubber bottoms. ?Are comfortable and fit you well. ?Are closed at the toe. Do not wear sandals. ?If you use a stepladder: ?Make sure that it is fully opened. Do not climb a closed stepladder. ?Make sure that both sides of the stepladder are locked into place. ?Ask someone to hold it for you, if possible. ?Clearly mark and make sure that you can see: ?Any grab bars or handrails. ?First and last steps. ?Where the edge of each step is. ?Use tools that  help you move around (mobility aids) if they are needed. These include: ?Canes. ?Walkers. ?Scooters. ?Crutches. ?Turn on the lights when you go into a dark area. Replace any light bulbs as soon as they burn o

## 2021-05-02 NOTE — Progress Notes (Signed)
Subjective:   Cody Rush is a 66 y.o. male who presents for an Initial Medicare Annual Wellness Visit.  Review of Systems           Objective:    Today's Vitals   05/02/21 0857  BP: 130/90  Pulse: (!) 59  Temp: (!) 97 F (36.1 C)  SpO2: 100%  Weight: 175 lb 1.6 oz (79.4 kg)  Height: '5\' 6"'$  (1.676 m)   Body mass index is 28.26 kg/m.     03/12/2020    9:51 AM 03/05/2020    8:58 AM 07/09/2016    1:58 PM 11/15/2015    9:27 AM  Advanced Directives  Does Patient Have a Medical Advance Directive? No No No No  Would patient like information on creating a medical advance directive? No - Patient declined No - Patient declined  Yes - Educational materials given    Current Medications (verified) Outpatient Encounter Medications as of 05/02/2021  Medication Sig   amLODipine (NORVASC) 5 MG tablet TAKE 1 TABLET(5 MG) BY MOUTH DAILY   aspirin 81 MG tablet Take 81 mg by mouth every other day.   CRANBERRY PO Take 4,200 mg by mouth daily.   lansoprazole (PREVACID) 30 MG capsule Take 1 capsule (30 mg total) by mouth daily.   Multiple Vitamins tablet Take 1 tablet by mouth daily.    tadalafil (CIALIS) 20 MG tablet Take 1 tablet every 3 days as needed.   vitamin C (ASCORBIC ACID) 500 MG tablet Take 500 mg by mouth daily.   Vitamin D3 (VITAMIN D) 25 MCG tablet Take 1,000 Units by mouth daily.   No facility-administered encounter medications on file as of 05/02/2021.    Allergies (verified) Patient has no known allergies.   History: Past Medical History:  Diagnosis Date   Complication of anesthesia    bradycardia/hypotension   GERD (gastroesophageal reflux disease)    Hypertension    Prostate cancer [facility])    Past Surgical History:  Procedure Laterality Date   FRACTURE SURGERY     left leg   INGUINAL HERNIA REPAIR Right 03/12/2020   Procedure: HERNIA REPAIR INGUINAL ADULT;  Surgeon: Robert Bellow, MD;  Location: [facility] ORS;  Service: General;  Laterality: Right;   KNEE  SURGERY     LITHOTRIPSY     THORACOSCOPY  11/2015   Family History  Problem Relation Age of Onset   Breast cancer Mother    Colon cancer Father    Stroke Father    Heart disease Father    Social History   Socioeconomic History   Marital status: Single    Spouse name: Not on file   Number of children: Not on file   Years of education: Not on file   Highest education level: Not on file  Occupational History   Not on file  Tobacco Use   Smoking status: Never   Smokeless tobacco: Former    Types: Nurse, children's Use: Never used  Substance and Sexual Activity   Alcohol use: No    Alcohol/week: 0.0 standard drinks   Drug use: No   Sexual activity: Yes    Birth control/protection: None  Other Topics Concern   Not on file  Social History Narrative   Not on file   Social Determinants of Health   Financial Resource Strain: Not on file  Food Insecurity: Not on file  Transportation Needs: Not on file  Physical Activity: Not on file  Stress: Not on  file  Social Connections: Not on file    Tobacco Counseling Counseling given: Not Answered   Clinical Intake:  Pre-visit preparation completed: Yes  Pain : No/denies pain     Nutritional Risks: None Diabetes: No  How often do you need to have someone help you when you read instructions, pamphlets, or other written materials from your doctor or pharmacy?: 1 - Never  Diabetic?no  Interpreter Needed?: No  Information entered by :: Kirke Shaggy, LPN   Activities of Daily Living    08/23/2020    9:02 AM  In your present state of health, do you have any difficulty performing the following activities:  Hearing? 1  Vision? 0  Difficulty concentrating or making decisions? 0  Walking or climbing stairs? 0  Dressing or bathing? 0  Doing errands, shopping? 0    Patient Care Team: Jerrol Banana., MD as PCP - General (Family Medicine)  Indicate any recent Medical Services you may have received  from other than [facility] providers in the past year (date may be approximate).     Assessment:   This is a routine wellness examination for Elo.  Hearing/Vision screen No results found.  Dietary issues and exercise activities discussed:     Goals Addressed   None    Depression Screen    08/23/2020    9:01 AM 08/11/2019    3:17 PM 07/20/2017    2:21 PM 07/09/2016    1:58 PM  PHQ 2/9 Scores  PHQ - 2 Score 0 0 0 0  PHQ- 9 Score 0 1  0    Fall Risk    08/23/2020    9:01 AM 08/11/2019    3:17 PM 07/20/2017    2:21 PM 07/09/2016    1:58 PM  [place] in the past year? 0 0 No Yes  Number falls in past yr: 0 0  1  Injury with Fall? 0 0  Yes  Risk for fall due to : No Fall Risks     Follow up Falls evaluation completed Falls evaluation completed      [place]:  Any stairs in or around the home? Yes  If so, are there any without handrails? No  Home free of loose throw rugs in walkways, pet beds, electrical cords, etc? Yes  Adequate lighting in your home to reduce risk of falls? Yes   ASSISTIVE DEVICES UTILIZED TO PREVENT FALLS:  Life alert? No  Use of a cane, walker or w/c? No  Grab bars in the bathroom? No  Shower chair or bench in shower? No  Elevated toilet seat or a handicapped toilet? Yes   TIMED UP AND GO:  Was the test performed? Yes .  Length of time to ambulate 10 feet: 4 sec.   Gait steady and fast without use of assistive device  Cognitive Function:        Immunizations Immunization History  Administered Date(s) Administered   Fluad Quad(high Dose 65+) 10/10/2019   Influenza Whole 12/14/2009   Influenza,inj,Quad PF,6+ Mos 11/20/2015   PFIZER Comirnaty(Gray Top)Covid-19 Tri-Sucrose Vaccine 12/23/2019   PFIZER(Purple Top)SARS-COV-2 Vaccination 03/14/2019, 04/04/2019   PNEUMOCOCCAL CONJUGATE-20 08/23/2020   Td 12/14/2009, 08/23/2020   Tdap 03/08/2008   Zoster, Live 05/03/2015    TDAP status: Up to  date  Flu Vaccine status: Declined, Education has been provided regarding the importance of this vaccine but patient still declined. Advised may receive this vaccine at local pharmacy or  Health Dept. Aware to provide a copy of the vaccination record if obtained from local pharmacy or Health Dept. Verbalized acceptance and understanding.  Pneumococcal vaccine status: Up to date  Covid-19 vaccine status: Completed vaccines  Qualifies for Shingles Vaccine? Yes   Zostavax completed Yes   Shingrix Completed?: No.    Education has been provided regarding the importance of this vaccine. Patient has been advised to call insurance company to determine out of pocket expense if they have not yet received this vaccine. Advised may also receive vaccine at local pharmacy or Health Dept. Verbalized acceptance and understanding.  Screening Tests Health Maintenance  Topic Date Due   Zoster Vaccines- Shingrix (1 of 2) Never done   COVID-19 Vaccine (4 - Booster for Pfizer series) 02/17/2020   INFLUENZA VACCINE  08/13/2021   COLONOSCOPY (Pts 45-51yr Insurance coverage will need to be confirmed)  06/25/2025   TETANUS/TDAP  08/24/2030   Pneumonia Vaccine 68 Years old  Completed   Hepatitis C Screening  Completed   HPV VACCINES  Aged Out    Health Maintenance  Health Maintenance Due  Topic Date Due   Zoster Vaccines- Shingrix (1 of 2) Never done   COVID-19 Vaccine (4 - Booster for Pfizer series) 02/17/2020    Colorectal cancer screening: Type of screening: Colonoscopy. Completed 06/26/15. Repeat every 10 years  Lung Cancer Screening: (Low Dose CT Chest recommended if Age 68-80years, 30 pack-year currently smoking OR have quit w/in 15years.) does not qualify.    Additional Screening:  Hepatitis C Screening: does qualify; Completed 05/26/14  Vision Screening: Recommended annual ophthalmology exams for early detection of glaucoma and other disorders of the eye. Is the patient up to date with their  annual eye exam?  Yes  Who is the provider or what is the name of the [facility] which the patient [facility] is not established with a provider, would they like to be referred to a provider to establish care? No .   Dental Screening: Recommended annual dental exams for proper oral hygiene  Community Resource Referral / Chronic Care Management: CRR required this visit?  No   CCM required this visit?  No      Plan:     I have personally reviewed and noted the following in the patient's chart:   Medical and social history Use of alcohol, tobacco or illicit drugs  Current medications and supplements including opioid prescriptions. Patient is not currently taking opioid prescriptions. Functional ability and status Nutritional status Physical activity Advanced directives List of other physicians Hospitalizations, surgeries, and ER visits in previous 12 months Vitals Screenings to include cognitive, depression, and falls Referrals and appointments  In addition, I have reviewed and discussed with patient certain preventive protocols, quality metrics, and best practice recommendations. A written personalized care plan for preventive services as well as general preventive health recommendations were provided to patient.     LDionisio Zaydenn LPN   48/29/5621  Nurse Notes: none

## 2021-06-14 ENCOUNTER — Other Ambulatory Visit: Payer: Medicare HMO

## 2021-06-14 DIAGNOSIS — C61 Malignant neoplasm of prostate: Secondary | ICD-10-CM | POA: Diagnosis not present

## 2021-06-15 LAB — PSA: Prostate Specific Ag, Serum: 7.9 ng/mL — ABNORMAL HIGH (ref 0.0–4.0)

## 2021-06-18 DIAGNOSIS — H903 Sensorineural hearing loss, bilateral: Secondary | ICD-10-CM | POA: Diagnosis not present

## 2021-06-18 DIAGNOSIS — H6123 Impacted cerumen, bilateral: Secondary | ICD-10-CM | POA: Diagnosis not present

## 2021-06-20 ENCOUNTER — Ambulatory Visit: Payer: Medicare HMO | Admitting: Urology

## 2021-06-20 ENCOUNTER — Encounter: Payer: Self-pay | Admitting: Urology

## 2021-06-20 VITALS — BP 176/91 | HR 73 | Ht 67.0 in | Wt 170.0 lb

## 2021-06-20 DIAGNOSIS — N529 Male erectile dysfunction, unspecified: Secondary | ICD-10-CM

## 2021-06-20 DIAGNOSIS — Z8546 Personal history of malignant neoplasm of prostate: Secondary | ICD-10-CM

## 2021-06-20 DIAGNOSIS — C61 Malignant neoplasm of prostate: Secondary | ICD-10-CM

## 2021-06-20 NOTE — Progress Notes (Signed)
   06/20/2021 3:50 PM   [name] [birth year] [id number]  Reason for visit: Follow up prostate cancer, ED  HPI: I saw Cody Rush in urology clinic for active surveillance for very low risk prostate cancer. Briefly, he is a healthy 66 year old male who presented with a elevated PSA of 4.6 with 20% free in November 2020.  Prostate biopsy at that time showed a 52 g gland with a PSA density of 0.09, 2/12 cores positive for gleason score 3+3=6 prostate cancer with 7% max core involvement.  This placed him in the very low risk category per the AUA guidelines. He underwent a confirmatory biopsy on 09/01/2019 showing a 53 g prostate with PSA density of 0.08, and 0/12 cores showed prostate cancer.    PSA 06/14/2021 increased to 7.9 from 4.8 last year, and 4.6 in September 2020.  DRE today 60 g smooth prostate no nodules or masses.  He takes Cialis as needed for ED with good results.  He denies any significant urinary symptoms.  We discussed options for his increase in PSA including a repeat PSA in 3 to 4 weeks, and the concept of a false positive elevation, prostate MRI, or repeat prostate biopsy.  Using shared decision making he opted for repeat PSA in 3 to 4 weeks.  If PSA remains significantly elevated at that time, would pursue prostate MRI  Billey Co, MD  [facility] [address], [place], [place] [address] [phone]

## 2021-07-19 ENCOUNTER — Other Ambulatory Visit: Payer: Medicare HMO

## 2021-07-19 DIAGNOSIS — C61 Malignant neoplasm of prostate: Secondary | ICD-10-CM

## 2021-07-20 LAB — PSA: Prostate Specific Ag, Serum: 6.4 ng/mL — ABNORMAL HIGH (ref 0.0–4.0)

## 2021-07-23 ENCOUNTER — Telehealth: Payer: Self-pay

## 2021-07-23 NOTE — Telephone Encounter (Signed)
-----   Message from Billey Co, MD sent at 07/23/2021  7:55 AM EDT ----- Good news, PSA decreased to 6.4.  Recommend continuing surveillance for his low risk prostate cancer with follow-up PSA in 6 months  Nickolas Madrid, MD 07/23/2021

## 2021-07-23 NOTE — Telephone Encounter (Signed)
Called pt, no answer. Left detailed message for pt per DPR. Appt scheduled. Lab ordered.

## 2021-07-30 DIAGNOSIS — Z8249 Family history of ischemic heart disease and other diseases of the circulatory system: Secondary | ICD-10-CM | POA: Diagnosis not present

## 2021-07-30 DIAGNOSIS — C61 Malignant neoplasm of prostate: Secondary | ICD-10-CM | POA: Diagnosis not present

## 2021-07-30 DIAGNOSIS — Z7982 Long term (current) use of aspirin: Secondary | ICD-10-CM | POA: Diagnosis not present

## 2021-07-30 DIAGNOSIS — Z008 Encounter for other general examination: Secondary | ICD-10-CM | POA: Diagnosis not present

## 2021-07-30 DIAGNOSIS — K219 Gastro-esophageal reflux disease without esophagitis: Secondary | ICD-10-CM | POA: Diagnosis not present

## 2021-07-30 DIAGNOSIS — I10 Essential (primary) hypertension: Secondary | ICD-10-CM | POA: Diagnosis not present

## 2021-07-30 DIAGNOSIS — Z87891 Personal history of nicotine dependence: Secondary | ICD-10-CM | POA: Diagnosis not present

## 2021-07-30 DIAGNOSIS — N529 Male erectile dysfunction, unspecified: Secondary | ICD-10-CM | POA: Diagnosis not present

## 2021-07-30 DIAGNOSIS — E785 Hyperlipidemia, unspecified: Secondary | ICD-10-CM | POA: Diagnosis not present

## 2021-07-30 DIAGNOSIS — Z823 Family history of stroke: Secondary | ICD-10-CM | POA: Diagnosis not present

## 2021-07-30 DIAGNOSIS — R32 Unspecified urinary incontinence: Secondary | ICD-10-CM | POA: Diagnosis not present

## 2021-08-27 ENCOUNTER — Other Ambulatory Visit: Payer: Self-pay | Admitting: Family Medicine

## 2021-08-27 ENCOUNTER — Ambulatory Visit (INDEPENDENT_AMBULATORY_CARE_PROVIDER_SITE_OTHER): Payer: Medicare HMO | Admitting: Family Medicine

## 2021-08-27 ENCOUNTER — Encounter: Payer: Self-pay | Admitting: Family Medicine

## 2021-08-27 VITALS — BP 120/78 | HR 91 | Resp 16 | Ht 67.0 in | Wt 177.0 lb

## 2021-08-27 DIAGNOSIS — Z0000 Encounter for general adult medical examination without abnormal findings: Secondary | ICD-10-CM | POA: Diagnosis not present

## 2021-08-27 DIAGNOSIS — K219 Gastro-esophageal reflux disease without esophagitis: Secondary | ICD-10-CM

## 2021-08-27 DIAGNOSIS — N529 Male erectile dysfunction, unspecified: Secondary | ICD-10-CM

## 2021-08-27 DIAGNOSIS — I10 Essential (primary) hypertension: Secondary | ICD-10-CM

## 2021-08-27 DIAGNOSIS — E7849 Other hyperlipidemia: Secondary | ICD-10-CM

## 2021-08-27 DIAGNOSIS — T1490XA Injury, unspecified, initial encounter: Secondary | ICD-10-CM | POA: Diagnosis not present

## 2021-08-27 DIAGNOSIS — J309 Allergic rhinitis, unspecified: Secondary | ICD-10-CM

## 2021-08-27 NOTE — Telephone Encounter (Signed)
Requested medication (s) are due for refill today: yes  Requested medication (s) are on the active medication list: yes  Last refill:  08/11/19 #30 5 RF  Future visit scheduled: had appt today  Notes to clinic:  no updated lab work   Requested Prescriptions  Pending Prescriptions Disp Refills   tadalafil (CIALIS) 20 MG tablet [Pharmacy Med Name: TADALAFIL 20 MG TABLET] 30 tablet 5    Sig: TAKE ONE TABLET BY MOUTH EVERY THREE DAYS AS NEEDED     Urology: Erectile Dysfunction Agents Failed - 08/27/2021  1:14 PM      Failed - AST in normal range and within 360 days    AST  Date Value Ref Range Status  08/24/2020 36 0 - 40 IU/L Final         Failed - ALT in normal range and within 360 days    ALT  Date Value Ref Range Status  08/24/2020 33 0 - 44 IU/L Final         Passed - Last BP in normal range    BP Readings from Last 1 Encounters:  08/27/21 120/78         Passed - Valid encounter within last 12 months    Recent Outpatient Visits           Today Annual physical exam   [facility] Jerrol Banana., MD   1 year ago Annual physical exam   [facility] Jerrol Banana., MD   1 year ago Non-recurrent unilateral inguinal hernia without obstruction or gangrene   [facility] Jerrol Banana., MD   1 year ago Primary hypertension   [facility] Jerrol Banana., MD   2 years ago Annual physical exam   [facility] Jerrol Banana., MD

## 2021-08-27 NOTE — Progress Notes (Unsigned)
Complete physical exam  I,[name],acting as a scribe for [name], MD.,have documented all relevant documentation on the behalf of [name], MD,as directed by  [name], MD while in the presence of [name], MD.   Patient: Cody Rush   DOB: 06-07-1955   66 y.o. Male  MRN: [id number] Visit Date: 08/27/2021  Today's healthcare provider: [name], MD   Chief Complaint  Patient presents with   Annual Exam   Subjective    Cody Rush is a 66 y.o. male who presents today for a complete physical exam.  He reports consuming a general diet. Home exercise routine includes walking and biking. He generally feels well. He reports sleeping well. He does not have additional problems to discuss today.  HPI  Patient had AWV with [facility] on 04/22/2021.  Past Medical History:  Diagnosis Date   Complication of anesthesia    bradycardia/hypotension   GERD (gastroesophageal reflux disease)    Hypertension    Prostate cancer [facility])    Past Surgical History:  Procedure Laterality Date   FRACTURE SURGERY     left leg   INGUINAL HERNIA REPAIR Right 03/12/2020   Procedure: HERNIA REPAIR INGUINAL ADULT;  Surgeon: Robert Bellow, MD;  Location: [facility] ORS;  Service: General;  Laterality: Right;   KNEE SURGERY     LITHOTRIPSY     THORACOSCOPY  11/2015   Social History   Socioeconomic History   Marital status: Single    Spouse name: Not on file   Number of children: Not on file   Years of education: Not on file   Highest education level: Not on file  Occupational History   Not on file  Tobacco Use   Smoking status: Never   Smokeless tobacco: Former    Types: Nurse, children's Use: Never used  Substance and Sexual Activity   Alcohol use: No    Alcohol/week: 0.0 standard drinks of alcohol   Drug use: No   Sexual activity: Yes    Birth control/protection: None  Other Topics Concern   Not on file  Social History  Narrative   Not on file   Social Determinants of Health   Financial Resource Strain: Low Risk  (05/02/2021)   Overall Financial Resource Strain (CARDIA)    Difficulty of Paying Living Expenses: Not hard at all  Food Insecurity: No Food Insecurity (05/02/2021)   Hunger Vital Sign    Worried About Running Out of Food in the Last Year: Never true    [place] in the Last Year: Never true  Transportation Needs: No Transportation Needs (05/02/2021)   PRAPARE - Hydrologist (Medical): No    Lack of Transportation (Non-Medical): No  Physical Activity: Sufficiently Active (05/02/2021)   Exercise Vital Sign    Days of Exercise per Week: 5 days    Minutes of Exercise per Session: 40 min  Stress: No Stress Concern Present (05/02/2021)   [place]    Feeling of Stress : Not at all  Social Connections: Moderately Integrated (05/02/2021)   Social Connection and Isolation Panel [NHANES]    Frequency of Communication with Friends and Family: More than three times a week    Frequency of Social Gatherings with Friends and Family: Once a week    Attends Religious Services: More than 4 times per year  Active Member of Clubs or Organizations: No    Attends Archivist Meetings: Never    Marital Status: Married  Human resources officer Violence: Not At Risk (05/02/2021)   Humiliation, Afraid, Rape, and Kick questionnaire    Fear of Current or Ex-Partner: No    Emotionally Abused: No    Physically Abused: No    Sexually Abused: No   Family Status  Relation Name Status   Mother  Deceased at age 18   Father  Deceased at age 34   Sister  Alive   Sister  50   Family History  Problem Relation Age of Onset   Breast cancer Mother    Colon cancer Father    Stroke Father    Heart disease Father    No Known Allergies  Patient Care Team: Jerrol Banana., MD as PCP - General (Family  Medicine)   Medications: Outpatient Medications Prior to Visit  Medication Sig   amLODipine (NORVASC) 5 MG tablet TAKE 1 TABLET(5 MG) BY MOUTH DAILY   aspirin 81 MG tablet Take 81 mg by mouth every other day.   CRANBERRY PO Take 4,200 mg by mouth daily.   hydrocortisone 2.5 % cream    lansoprazole (PREVACID) 30 MG capsule Take 1 capsule (30 mg total) by mouth daily.   Multiple Vitamins tablet Take 1 tablet by mouth daily.    tadalafil (CIALIS) 20 MG tablet Take 1 tablet every 3 days as needed. (Patient taking differently: Take 20 mg by mouth as needed for erectile dysfunction.)   vitamin C (ASCORBIC ACID) 500 MG tablet Take 500 mg by mouth daily.   Vitamin D3 (VITAMIN D) 25 MCG tablet Take 1,000 Units by mouth daily.   No facility-administered medications prior to visit.    Review of Systems  Eyes:  Positive for photophobia.  Gastrointestinal:  Positive for abdominal distention.  Musculoskeletal:  Positive for neck pain and neck stiffness.  All other systems reviewed and are negative.   {Labs  Heme  Chem  Endocrine  Serology  Results Review (optional):23779}  Objective     BP 120/78 (BP Location: Right Arm, Patient Position: Sitting, Cuff Size: Normal)   Pulse 91   Resp 16   Ht '5\' 7"'$  (1.702 m)   Wt 177 lb (80.3 kg)   SpO2 97%   BMI 27.72 kg/m  {Show previous vital signs (optional):23777}   Physical Exam  ***  Last depression screening scores    08/27/2021    9:46 AM 05/02/2021    9:02 AM 08/23/2020    9:01 AM  PHQ 2/9 Scores  PHQ - 2 Score 0 0 0  PHQ- 9 Score 0  0   Last fall risk screening    08/27/2021    9:46 AM  Fall Risk   Falls in the past year? 0  Number falls in past yr: 0  Injury with Fall? 0  Risk for fall due to : No Fall Risks  Follow up Falls evaluation completed   Last Audit-C alcohol use screening    08/27/2021    9:46 AM  Alcohol Use Disorder Test (AUDIT)  1. How often do you have a drink containing alcohol? 0  2. How many drinks  containing alcohol do you have on a typical day when you are drinking? 0  3. How often do you have six or more drinks on one occasion? 0  AUDIT-C Score 0   A score of 3 or more in women, and 4  or more in men indicates increased risk for alcohol abuse, EXCEPT if all of the points are from question 1   No results found for any visits on 08/27/21.  Assessment & Plan    Routine Health Maintenance and Physical Exam  Exercise Activities and Dietary recommendations  Goals      DIET - EAT MORE FRUITS AND VEGETABLES        Immunization History  Administered Date(s) Administered   Fluad Quad(high Dose 65+) 10/10/2019   Influenza Whole 12/14/2009   Influenza,inj,Quad PF,6+ Mos 11/20/2015   PFIZER Comirnaty(Gray Top)Covid-19 Tri-Sucrose Vaccine 12/23/2019   PFIZER(Purple Top)SARS-COV-2 Vaccination 03/14/2019, 04/04/2019   PNEUMOCOCCAL CONJUGATE-20 08/23/2020   Td 12/14/2009, 08/23/2020   Tdap 03/08/2008   Zoster, Live 05/03/2015    Health Maintenance  Topic Date Due   Zoster Vaccines- Shingrix (1 of 2) Never done   COVID-19 Vaccine (4 - Pfizer risk series) 02/17/2020   INFLUENZA VACCINE  08/13/2021   COLONOSCOPY (Pts 45-80yr Insurance coverage will need to be confirmed)  06/25/2025   TETANUS/TDAP  08/24/2030   Pneumonia Vaccine 66 Years old  Completed   Hepatitis C Screening  Completed   HPV VACCINES  Aged Out    Discussed health benefits of physical activity, and encouraged him to engage in regular exercise appropriate for his age and condition.  ***  No follow-ups on file.     {provider attestation***:1}   RWilhemena Durie MD  [id number] [phone](fax)  [place]

## 2021-08-27 NOTE — Telephone Encounter (Signed)
Please advise refill? Rx has not been filled since 07/2019.

## 2021-08-29 DIAGNOSIS — I10 Essential (primary) hypertension: Secondary | ICD-10-CM | POA: Diagnosis not present

## 2021-08-29 DIAGNOSIS — K219 Gastro-esophageal reflux disease without esophagitis: Secondary | ICD-10-CM | POA: Diagnosis not present

## 2021-08-29 DIAGNOSIS — J309 Allergic rhinitis, unspecified: Secondary | ICD-10-CM | POA: Diagnosis not present

## 2021-08-29 DIAGNOSIS — E7849 Other hyperlipidemia: Secondary | ICD-10-CM | POA: Diagnosis not present

## 2021-08-30 LAB — CBC WITH DIFFERENTIAL/PLATELET
Basophils Absolute: 0.1 10*3/uL (ref 0.0–0.2)
Basos: 1 %
EOS (ABSOLUTE): 0.2 10*3/uL (ref 0.0–0.4)
Eos: 3 %
Hematocrit: 45.4 % (ref 37.5–51.0)
Hemoglobin: 16.2 g/dL (ref 13.0–17.7)
Immature Grans (Abs): 0 10*3/uL (ref 0.0–0.1)
Immature Granulocytes: 0 %
Lymphocytes Absolute: 1.1 10*3/uL (ref 0.7–3.1)
Lymphs: 19 %
MCH: 31.3 pg (ref 26.6–33.0)
MCHC: 35.7 g/dL (ref 31.5–35.7)
MCV: 88 fL (ref 79–97)
Monocytes Absolute: 0.6 10*3/uL (ref 0.1–0.9)
Monocytes: 11 %
Neutrophils Absolute: 3.8 10*3/uL (ref 1.4–7.0)
Neutrophils: 66 %
Platelets: 200 10*3/uL (ref 150–450)
RBC: 5.17 x10E6/uL (ref 4.14–5.80)
RDW: 12 % (ref 11.6–15.4)
WBC: 5.8 10*3/uL (ref 3.4–10.8)

## 2021-08-30 LAB — COMPREHENSIVE METABOLIC PANEL
ALT: 26 IU/L (ref 0–44)
AST: 27 IU/L (ref 0–40)
Albumin/Globulin Ratio: 1.6 (ref 1.2–2.2)
Albumin: 4.4 g/dL (ref 3.9–4.9)
Alkaline Phosphatase: 69 IU/L (ref 44–121)
BUN/Creatinine Ratio: 14 (ref 10–24)
BUN: 19 mg/dL (ref 8–27)
Bilirubin Total: 0.6 mg/dL (ref 0.0–1.2)
CO2: 21 mmol/L (ref 20–29)
Calcium: 9.4 mg/dL (ref 8.6–10.2)
Chloride: 101 mmol/L (ref 96–106)
Creatinine, Ser: 1.32 mg/dL — ABNORMAL HIGH (ref 0.76–1.27)
Globulin, Total: 2.7 g/dL (ref 1.5–4.5)
Glucose: 92 mg/dL (ref 70–99)
Potassium: 4.4 mmol/L (ref 3.5–5.2)
Sodium: 137 mmol/L (ref 134–144)
Total Protein: 7.1 g/dL (ref 6.0–8.5)
eGFR: 59 mL/min/{1.73_m2} — ABNORMAL LOW (ref >59)

## 2021-08-30 LAB — LIPID PANEL
Chol/HDL Ratio: 4.7 ratio (ref 0.0–5.0)
Cholesterol, Total: 168 mg/dL (ref 100–199)
HDL: 36 mg/dL — ABNORMAL LOW (ref >39)
LDL Chol Calc (NIH): 114 mg/dL — ABNORMAL HIGH (ref 0–99)
Triglycerides: 99 mg/dL (ref 0–149)
VLDL Cholesterol Cal: 18 mg/dL (ref 5–40)

## 2021-08-30 LAB — TSH: TSH: 2.29 u[IU]/mL (ref 0.450–4.500)

## 2021-10-21 DIAGNOSIS — H2513 Age-related nuclear cataract, bilateral: Secondary | ICD-10-CM | POA: Diagnosis not present

## 2021-11-07 ENCOUNTER — Telehealth: Payer: Self-pay | Admitting: Family Medicine

## 2021-11-07 ENCOUNTER — Other Ambulatory Visit: Payer: Self-pay

## 2021-11-07 DIAGNOSIS — I10 Essential (primary) hypertension: Secondary | ICD-10-CM

## 2021-11-07 MED ORDER — AMLODIPINE BESYLATE 5 MG PO TABS: ORAL_TABLET | ORAL | 3 refills | Status: AC

## 2021-11-07 NOTE — Telephone Encounter (Signed)
[facility] faxed refill request for the following medications:  amLODipine (NORVASC) 5 MG tablet    Please advise.

## 2021-12-25 ENCOUNTER — Ambulatory Visit: Payer: Self-pay | Admitting: *Deleted

## 2021-12-25 ENCOUNTER — Ambulatory Visit (INDEPENDENT_AMBULATORY_CARE_PROVIDER_SITE_OTHER): Payer: Medicare HMO | Admitting: Family Medicine

## 2021-12-25 ENCOUNTER — Encounter: Payer: Self-pay | Admitting: Family Medicine

## 2021-12-25 VITALS — BP 140/96 | HR 74 | Temp 99.5°F | Resp 16 | Wt 176.0 lb

## 2021-12-25 DIAGNOSIS — R509 Fever, unspecified: Secondary | ICD-10-CM | POA: Diagnosis not present

## 2021-12-25 DIAGNOSIS — I10 Essential (primary) hypertension: Secondary | ICD-10-CM

## 2021-12-25 DIAGNOSIS — U071 COVID-19: Secondary | ICD-10-CM

## 2021-12-25 LAB — POC COVID19 BINAXNOW: SARS Coronavirus 2 Ag: POSITIVE — AB

## 2021-12-25 MED ORDER — NIRMATRELVIR/RITONAVIR (PAXLOVID)TABLET: 3.0000 | ORAL_TABLET | Freq: Two times a day (BID) | ORAL | 0 refills | Status: AC

## 2021-12-25 NOTE — Progress Notes (Signed)
    SUBJECTIVE:   CHIEF COMPLAINT / HPI:   UPPER RESPIRATORY TRACT INFECTION - COVID positive - symptom onset 2 days ago  Fever: yes, low grade Cough: yes Shortness of breath: no Chest pain: no Chest congestion: no Nasal congestion: yes Runny nose: yes Vomiting: no, some nausea Sick contacts: yes Relief with OTC cold/cough medications: hasn't tried  Treatments attempted: nasal saline, ibuprofen    OBJECTIVE:   BP (!) 140/96 (BP Location: Left Arm, Patient Position: Sitting, Cuff Size: Large)   Pulse 74   Resp 16   Wt 176 lb (79.8 kg)   SpO2 99%   BMI 27.57 kg/m   Gen: well appearing, in NAD Card: RRR Lungs: CTAB Ext: WWP, no edema  ASSESSMENT/PLAN:   COVID-19 Moderate sx. Is a candidate for COVID treatment, rx paxlovid sent to pharmacy. Reviewed OTC symptom relief, self-quarantine guidelines, and emergency precautions.    Myles Gip, DO

## 2021-12-25 NOTE — Telephone Encounter (Signed)
Patient was seen in office

## 2021-12-25 NOTE — Telephone Encounter (Signed)
  Chief Complaint: Cough Symptoms: Productive cough, generalized weakness, congestion,fatigue, 101.0 last night Frequency: Monday Pertinent Negatives: Patient denies SOB Disposition: '[]'$ ED /'[]'$ Urgent Care (no appt availability in office) / '[x]'$ Appointment(In office/virtual)/ '[]'$  [facility]/ '[]'$ Home Care/ '[]'$ Refused Recommended Disposition /'[]'$ [place] [facility]/ '[]'$  Follow-up with PCP Additional Notes: Appt secured for today. Care advise provided, pt verbalizes understanding. Advised to Covid test prior to appt if able, states has home tests, will do so and alert practice of results.   Reason for Disposition  [1] Continuous (nonstop) coughing interferes with work or school AND [2] no improvement using cough treatment per Care Advice  Answer Assessment - Initial Assessment Questions 1. ONSET: "When did the cough begin?"      Monday 2. SEVERITY: "How bad is the cough today?"      varies 3. SPUTUM: "Describe the color of your sputum" (none, dry cough; clear, white, yellow, green)     Has not noticed 4. HEMOPTYSIS: "Are you coughing up any blood?" If so ask: "How much?" (flecks, streaks, tablespoons, etc.)     no 5. DIFFICULTY BREATHING: "Are you having difficulty breathing?" If Yes, ask: "How bad is it?" (e.g., mild, moderate, severe)    - MILD: No SOB at rest, mild SOB with walking, speaks normally in sentences, can lie down, no retractions, pulse < 100.    - MODERATE: SOB at rest, SOB with minimal exertion and prefers to sit, cannot lie down flat, speaks in phrases, mild retractions, audible wheezing, pulse 100-120.    - SEVERE: Very SOB at rest, speaks in single words, struggling to breathe, sitting hunched forward, retractions, pulse > 120      no 6. FEVER: "Do you have a fever?" If Yes, ask: "What is your temperature, how was it measured, and when did it start?"     101.0 last night 10. OTHER SYMPTOMS: "Do you have any other symptoms?" (e.g., runny nose, wheezing, chest  pain)       Generalized weakness, fatigues  Protocols used: Cough - Acute Productive-A-AH

## 2021-12-25 NOTE — Patient Instructions (Signed)
It was great to see you!  Our plans for today:  - See below for self-isolation guidelines. You may end your quarantine once you are 10 days from symptom onset and fever free for 24 hours without use of tylenol or ibuprofen. Wear a well-fitting N95 mask if you have to go out and about. - Take the paxlovid as directed. See HotterNames.de for more information about the medication. - I recommend getting vaccinated once you are healed from your current infection. - Certainly, if you are having difficulties breathing or unable to keep down fluids, go to the Emergency Department.   Take care and seek immediate care sooner if you develop any concerns.   Dr. Ky Barban     Person Under Monitoring Name: Cody Rush  Location: [place] [address]   Infection Prevention Recommendations for Individuals Confirmed to have, or Being Evaluated for, 2019 Novel Coronavirus (COVID-19) Infection Who Receive Care at Home  Individuals who are confirmed to have, or are being evaluated for, COVID-19 should follow the prevention steps below until a healthcare provider or local or state health department says they can return to normal activities.  Stay home except to get medical care You should restrict activities outside your home, except for getting medical care. Do not go to work, school, or public areas, and do not use public transportation or taxis.  Call ahead before visiting your doctor Before your medical appointment, call the healthcare provider and tell them that you have, or are being evaluated for, COVID-19 infection. This will help the healthcare provider's office take steps to keep other people from getting infected. Ask your healthcare provider to call the local or state health department.  Monitor your symptoms Seek prompt medical attention if your illness is worsening (e.g., difficulty breathing). Before going to your  medical appointment, call the healthcare provider and tell them that you have, or are being evaluated for, COVID-19 infection. Ask your healthcare provider to call the local or state health department.  Wear a facemask You should wear a facemask that covers your nose and mouth when you are in the same room with other people and when you visit a healthcare provider. People who live with or visit you should also wear a facemask while they are in the same room with you.  Separate yourself from other people in your home As much as possible, you should stay in a different room from other people in your home. Also, you should use a separate bathroom, if available.  Avoid sharing household items You should not share dishes, drinking glasses, cups, eating utensils, towels, bedding, or other items with other people in your home. After using these items, you should wash them thoroughly with soap and water.  Cover your coughs and sneezes Cover your mouth and nose with a tissue when you cough or sneeze, or you can cough or sneeze into your sleeve. Throw used tissues in a lined trash can, and immediately wash your hands with soap and water for at least 20 seconds or use an alcohol-based hand rub.  Wash your [facility] your hands often and thoroughly with soap and water for at least 20 seconds. You can use an alcohol-based hand sanitizer if soap and water are not available and if your hands are not visibly dirty. Avoid touching your eyes, nose, and mouth with unwashed hands.   Prevention Steps for Caregivers and Household Members of Individuals Confirmed to have, or Being Evaluated for, COVID-19 Infection Being  Cared for in the Home  If you live with, or provide care at home for, a person confirmed to have, or being evaluated for, COVID-19 infection please follow these guidelines to prevent infection:  Follow healthcare provider's instructions Make sure that you understand and can help the  patient follow any healthcare provider instructions for all care.  Provide for the patient's basic needs You should help the patient with basic needs in the home and provide support for getting groceries, prescriptions, and other personal needs.  Monitor the patient's symptoms If they are getting sicker, call his or her medical provider and tell them that the patient has, or is being evaluated for, COVID-19 infection. This will help the healthcare provider's office take steps to keep other people from getting infected. Ask the healthcare provider to call the local or state health department.  Limit the number of people who have contact with the patient If possible, have only one caregiver for the patient. Other household members should stay in another home or place of residence. If this is not possible, they should stay in another room, or be separated from the patient as much as possible. Use a separate bathroom, if available. Restrict visitors who do not have an essential need to be in the home.  Keep older adults, very young children, and other sick people away from the patient Keep older adults, very young children, and those who have compromised immune systems or chronic health conditions away from the patient. This includes people with chronic heart, lung, or kidney conditions, diabetes, and cancer.  Ensure good ventilation Make sure that shared spaces in the home have good air flow, such as from an air conditioner or an opened window, weather permitting.  Wash your hands often Wash your hands often and thoroughly with soap and water for at least 20 seconds. You can use an alcohol based hand sanitizer if soap and water are not available and if your hands are not visibly dirty. Avoid touching your eyes, nose, and mouth with unwashed hands. Use disposable paper towels to dry your hands. If not available, use dedicated cloth towels and replace them when they become wet.  Wear a  facemask and gloves Wear a disposable facemask at all times in the room and gloves when you touch or have contact with the patient's blood, body fluids, and/or secretions or excretions, such as sweat, saliva, sputum, nasal mucus, vomit, urine, or feces.  Ensure the mask fits over your nose and mouth tightly, and do not touch it during use. Throw out disposable facemasks and gloves after using them. Do not reuse. Wash your hands immediately after removing your facemask and gloves. If your personal clothing becomes contaminated, carefully remove clothing and launder. Wash your hands after handling contaminated clothing. Place all used disposable facemasks, gloves, and other waste in a lined container before disposing them with other household waste. Remove gloves and wash your hands immediately after handling these items.  Do not share dishes, glasses, or other household items with the patient Avoid sharing household items. You should not share dishes, drinking glasses, cups, eating utensils, towels, bedding, or other items with a patient who is confirmed to have, or being evaluated for, COVID-19 infection. After the person uses these items, you should wash them thoroughly with soap and water.  Wash laundry thoroughly Immediately remove and wash clothes or bedding that have blood, body fluids, and/or secretions or excretions, such as sweat, saliva, sputum, nasal mucus, vomit, urine, or feces, on them.  Wear gloves when handling laundry from the patient. Read and follow directions on labels of laundry or clothing items and detergent. In general, wash and dry with the warmest temperatures recommended on the label.  Clean all areas the individual has used often Clean all touchable surfaces, such as counters, tabletops, doorknobs, bathroom fixtures, toilets, phones, keyboards, tablets, and bedside tables, every day. Also, clean any surfaces that may have blood, body fluids, and/or secretions or excretions  on them. Wear gloves when cleaning surfaces the patient has come in contact with. Use a diluted bleach solution (e.g., dilute bleach with 1 part bleach and 10 parts water) or a household disinfectant with a label that says EPA-registered for coronaviruses. To make a bleach solution at home, add 1 tablespoon of bleach to 1 quart (4 cups) of water. For a larger supply, add  cup of bleach to 1 gallon (16 cups) of water. Read labels of cleaning products and follow recommendations provided on product labels. Labels contain instructions for safe and effective use of the cleaning product including precautions you should take when applying the product, such as wearing gloves or eye protection and making sure you have good ventilation during use of the product. Remove gloves and wash hands immediately after cleaning.  Monitor yourself for signs and symptoms of illness Caregivers and household members are considered close contacts, should monitor their health, and will be asked to limit movement outside of the home to the extent possible. Follow the monitoring steps for close contacts listed on the symptom monitoring form.   ? If you have additional questions, contact your local health department or call the epidemiologist on call at [phone] (available 24/7). ? This guidance is subject to change. For the most up-to-date guidance from [facility], please refer to their website: YouBlogs.pl

## 2021-12-25 NOTE — Telephone Encounter (Signed)
Pt. Has tested positive for COVID 19 at home. Has appointment today.

## 2022-01-23 ENCOUNTER — Other Ambulatory Visit: Payer: Self-pay | Admitting: *Deleted

## 2022-01-23 ENCOUNTER — Other Ambulatory Visit: Payer: Medicare HMO

## 2022-01-23 DIAGNOSIS — C61 Malignant neoplasm of prostate: Secondary | ICD-10-CM

## 2022-01-24 LAB — PSA: Prostate Specific Ag, Serum: 6.5 ng/mL — ABNORMAL HIGH (ref 0.0–4.0)

## 2022-01-30 ENCOUNTER — Ambulatory Visit: Payer: Medicare HMO | Admitting: Urology

## 2022-02-05 ENCOUNTER — Encounter: Payer: Self-pay | Admitting: Urology

## 2022-02-05 ENCOUNTER — Ambulatory Visit: Payer: Medicare HMO | Admitting: Urology

## 2022-02-05 DIAGNOSIS — N529 Male erectile dysfunction, unspecified: Secondary | ICD-10-CM

## 2022-02-05 DIAGNOSIS — C61 Malignant neoplasm of prostate: Secondary | ICD-10-CM

## 2022-02-05 MED ORDER — TADALAFIL 20 MG PO TABS: 20.0000 mg | ORAL_TABLET | ORAL | 5 refills | Status: DC | PRN

## 2022-02-05 NOTE — Progress Notes (Signed)
   02/05/2022 8:51 AM   Cody Rush 1955/05/22 [id number]  Reason for visit: Follow up prostate cancer, ED  HPI: I saw Cody Rush in urology clinic for active surveillance for very low risk prostate cancer. Briefly, he is a healthy 67 year old male who presented with a elevated PSA of 4.6 with 20% free in November 2020.  Prostate biopsy at that time showed a 52 g gland with a PSA density of 0.09, 2/12 cores positive for gleason score 3+3=6 prostate cancer with 7% max core involvement.  This placed him in the very low risk category per the AUA guidelines. He underwent a confirmatory biopsy on 09/01/2019 showing a 53 g prostate with PSA density of 0.08, and 0/12 cores showed prostate cancer.    PSA 06/14/2021 increased to 7.9 from 4.8 last year, and 4.6 in September 2020.  He opted for a repeat PSA which dropped to 6.4 in July 2023.  DRE at that time was benign.  Most recent PSA on 01/23/2022 stable at 6.5 from 6.4 previously.  He takes 20 mg Cialis as needed for ED with good results.  He denies any significant urinary symptoms.  We again reviewed options including continuing active surveillance with PSA every 6 to 12 months, prostate MRI, or consideration of repeat biopsy.  Using shared decision making, he opted for a repeat PSA in 9 months which is very reasonable.  We discussed the very low risk of progression to metastatic disease while on active surveillance, and the 10 to 20% risk of future upstaging requiring more definitive treatment with surgery or radiation.   Billey Co, [facility] [address] [place],  [address] [phone]

## 2022-02-06 ENCOUNTER — Ambulatory Visit: Payer: Medicare HMO | Admitting: Urology

## 2022-04-01 ENCOUNTER — Other Ambulatory Visit: Payer: Self-pay

## 2022-04-01 ENCOUNTER — Telehealth: Payer: Self-pay | Admitting: Family Medicine

## 2022-04-01 NOTE — Telephone Encounter (Signed)
[facility] faxed refill request for the following medications:   lansoprazole (PREVACID) 30 MG capsule     Please advise

## 2022-04-03 MED ORDER — LANSOPRAZOLE 30 MG PO CPDR: 30.0000 mg | DELAYED_RELEASE_CAPSULE | Freq: Every day | ORAL | 0 refills | Status: DC

## 2022-06-29 ENCOUNTER — Other Ambulatory Visit: Payer: Self-pay | Admitting: Family Medicine

## 2022-08-27 DIAGNOSIS — J309 Allergic rhinitis, unspecified: Secondary | ICD-10-CM | POA: Diagnosis not present

## 2022-08-27 DIAGNOSIS — I10 Essential (primary) hypertension: Secondary | ICD-10-CM | POA: Diagnosis not present

## 2022-08-27 DIAGNOSIS — E782 Mixed hyperlipidemia: Secondary | ICD-10-CM | POA: Diagnosis not present

## 2022-08-27 DIAGNOSIS — K219 Gastro-esophageal reflux disease without esophagitis: Secondary | ICD-10-CM | POA: Diagnosis not present

## 2022-08-27 DIAGNOSIS — Z0000 Encounter for general adult medical examination without abnormal findings: Secondary | ICD-10-CM | POA: Diagnosis not present

## 2022-08-27 DIAGNOSIS — L570 Actinic keratosis: Secondary | ICD-10-CM | POA: Diagnosis not present

## 2022-08-27 DIAGNOSIS — C61 Malignant neoplasm of prostate: Secondary | ICD-10-CM | POA: Diagnosis not present

## 2022-09-12 DIAGNOSIS — G43109 Migraine with aura, not intractable, without status migrainosus: Secondary | ICD-10-CM | POA: Diagnosis not present

## 2022-09-12 DIAGNOSIS — H524 Presbyopia: Secondary | ICD-10-CM | POA: Diagnosis not present

## 2022-09-12 DIAGNOSIS — H2513 Age-related nuclear cataract, bilateral: Secondary | ICD-10-CM | POA: Diagnosis not present

## 2022-09-30 ENCOUNTER — Other Ambulatory Visit: Payer: Self-pay | Admitting: Family Medicine

## 2022-11-07 ENCOUNTER — Other Ambulatory Visit: Payer: Medicare HMO

## 2022-11-07 DIAGNOSIS — C61 Malignant neoplasm of prostate: Secondary | ICD-10-CM

## 2022-11-08 ENCOUNTER — Other Ambulatory Visit: Payer: Self-pay | Admitting: Physician Assistant

## 2022-11-08 DIAGNOSIS — I10 Essential (primary) hypertension: Secondary | ICD-10-CM

## 2022-11-08 LAB — PSA: Prostate Specific Ag, Serum: 6.4 ng/mL — ABNORMAL HIGH (ref 0.0–4.0)

## 2022-11-13 ENCOUNTER — Encounter: Payer: Self-pay | Admitting: Urology

## 2022-11-13 ENCOUNTER — Ambulatory Visit: Payer: Medicare HMO | Admitting: Urology

## 2022-11-13 VITALS — BP 170/83 | HR 70 | Ht 67.0 in | Wt 172.0 lb

## 2022-11-13 DIAGNOSIS — N529 Male erectile dysfunction, unspecified: Secondary | ICD-10-CM | POA: Diagnosis not present

## 2022-11-13 DIAGNOSIS — C61 Malignant neoplasm of prostate: Secondary | ICD-10-CM

## 2022-11-13 MED ORDER — TADALAFIL 20 MG PO TABS: 20.0000 mg | ORAL_TABLET | ORAL | 5 refills | Status: DC | PRN

## 2022-11-13 NOTE — Progress Notes (Signed)
11/13/2022 9:46 AM   Cody Rush Jul 22, 1955 [id number]  Reason for visit: Follow up prostate cancer, ED   HPI: I saw Cody Rush in urology clinic for active surveillance for very low risk prostate cancer. He is a healthy 67 year old male who presented with a elevated PSA of 4.6 with 20% free in November 2020.  Prostate biopsy at that time showed a 52 g gland with a PSA density of 0.09, 2/12 cores positive for gleason score 3+3=6 prostate cancer with 7% max core involvement.  This placed him in the very low risk category per the AUA guidelines. He underwent a confirmatory biopsy on 09/01/2019 showing a 53 g prostate with PSA density of 0.08, and 0/12 cores showed prostate cancer.     PSA 06/14/2021 increased to 7.9 from 4.8, and 4.6 in September 2020.  He opted for a repeat PSA which dropped to 6.4 in July 2023.  DRE has been benign.   Most recent PSA on 11/07/2022 6.4 which is stable from 6.5 in January 2024 and 6.4 in July 2023.   He takes 20 mg Cialis as needed for ED with good results.  He denies any significant urinary symptoms.   -We again reviewed options including continuing active surveillance with PSA every 6 to 12 months, prostate MRI, or consideration of repeat biopsy.  Using shared decision making, he opted for a repeat PSA in 1 year which is very reasonable.  We discussed the very low risk of progression to metastatic disease while on active surveillance, and the 10 to 20% risk of future upstaging requiring more definitive treatment with surgery or radiation.  Could consider prostate biopsy in the next 1 to 2 years for routine surveillance. -Cialis refilled  Sondra Come, MD  [facility] [address] [place], [place] [address] [phone]

## 2022-11-24 ENCOUNTER — Other Ambulatory Visit: Payer: Self-pay | Admitting: Physician Assistant

## 2022-11-24 DIAGNOSIS — I10 Essential (primary) hypertension: Secondary | ICD-10-CM

## 2022-12-19 DIAGNOSIS — J309 Allergic rhinitis, unspecified: Secondary | ICD-10-CM | POA: Diagnosis not present

## 2022-12-19 DIAGNOSIS — J324 Chronic pansinusitis: Secondary | ICD-10-CM | POA: Diagnosis not present

## 2023-08-20 ENCOUNTER — Encounter: Payer: Self-pay | Admitting: Urology

## 2023-09-17 ENCOUNTER — Encounter: Payer: Self-pay | Admitting: Dermatology

## 2023-09-17 ENCOUNTER — Ambulatory Visit (INDEPENDENT_AMBULATORY_CARE_PROVIDER_SITE_OTHER): Payer: Medicare HMO | Admitting: Dermatology

## 2023-09-17 DIAGNOSIS — L209 Atopic dermatitis, unspecified: Secondary | ICD-10-CM

## 2023-09-17 DIAGNOSIS — W908XXA Exposure to other nonionizing radiation, initial encounter: Secondary | ICD-10-CM

## 2023-09-17 DIAGNOSIS — I781 Nevus, non-neoplastic: Secondary | ICD-10-CM

## 2023-09-17 DIAGNOSIS — D1801 Hemangioma of skin and subcutaneous tissue: Secondary | ICD-10-CM

## 2023-09-17 DIAGNOSIS — L814 Other melanin hyperpigmentation: Secondary | ICD-10-CM

## 2023-09-17 DIAGNOSIS — L820 Inflamed seborrheic keratosis: Secondary | ICD-10-CM | POA: Diagnosis not present

## 2023-09-17 DIAGNOSIS — L2089 Other atopic dermatitis: Secondary | ICD-10-CM

## 2023-09-17 DIAGNOSIS — Z7189 Other specified counseling: Secondary | ICD-10-CM

## 2023-09-17 DIAGNOSIS — L578 Other skin changes due to chronic exposure to nonionizing radiation: Secondary | ICD-10-CM | POA: Diagnosis not present

## 2023-09-17 DIAGNOSIS — I872 Venous insufficiency (chronic) (peripheral): Secondary | ICD-10-CM

## 2023-09-17 DIAGNOSIS — D229 Melanocytic nevi, unspecified: Secondary | ICD-10-CM

## 2023-09-17 DIAGNOSIS — Z1283 Encounter for screening for malignant neoplasm of skin: Secondary | ICD-10-CM | POA: Diagnosis not present

## 2023-09-17 DIAGNOSIS — L219 Seborrheic dermatitis, unspecified: Secondary | ICD-10-CM

## 2023-09-17 DIAGNOSIS — Z79899 Other long term (current) drug therapy: Secondary | ICD-10-CM

## 2023-09-17 DIAGNOSIS — L299 Pruritus, unspecified: Secondary | ICD-10-CM

## 2023-09-17 MED ORDER — MOMETASONE FUROATE 0.1 % EX CREA: TOPICAL_CREAM | CUTANEOUS | 4 refills | Status: AC

## 2023-09-17 NOTE — Patient Instructions (Addendum)
 For rash at right foot  Start mometasone  0.1 % cream - apply topically once daily 3 to 5 days a week as needed for itchy rash   For seborrheic dermatitis at left earlobe   Start mometasone  cream - apply topically to itchy area at left earlobe daily 3 to 5 days a weeks as needed   Topical steroids (such as triamcinolone, fluocinolone, fluocinonide, mometasone , clobetasol, halobetasol, betamethasone, hydrocortisone ) can cause thinning and lightening of the skin if they are used for too long in the same area. Your physician has selected the right strength medicine for your problem and area affected on the body. Please use your medication only as directed by your physician to prevent side effects.      Seborrheic Keratosis  What causes seborrheic keratoses? Seborrheic keratoses are harmless, common skin growths that first appear during adult life.  As time goes by, more growths appear.  Some people may develop a large number of them.  Seborrheic keratoses appear on both covered and uncovered body parts.  They are not caused by sunlight.  The tendency to develop seborrheic keratoses can be inherited.  They vary in color from skin-colored to gray, brown, or even black.  They can be either smooth or have a rough, warty surface.   Seborrheic keratoses are superficial and look as if they were stuck on the skin.  Under the microscope this type of keratosis looks like layers upon layers of skin.  That is why at times the top layer may seem to fall off, but the rest of the growth remains and re-grows.    Treatment Seborrheic keratoses do not need to be treated, but can easily be removed in the office.  Seborrheic keratoses often cause symptoms when they rub on clothing or jewelry.  Lesions can be in the way of shaving.  If they become inflamed, they can cause itching, soreness, or burning.  Removal of a seborrheic keratosis can be accomplished by freezing, burning, or surgery. If any spot bleeds, scabs, or  grows rapidly, please return to have it checked, as these can be an indication of a skin cancer.   Cryotherapy Aftercare  Wash gently with soap and water everyday.   Apply Vaseline and Band-Aid daily until healed.    Melanoma ABCDEs  Melanoma is the most dangerous type of skin cancer, and is the leading cause of death from skin disease.  You are more likely to develop melanoma if you: Have light-colored skin, light-colored eyes, or red or blond hair Spend a lot of time in the sun Tan regularly, either outdoors or in a tanning bed Have had blistering sunburns, especially during childhood Have a close family member who has had a melanoma Have atypical moles or large birthmarks  Early detection of melanoma is key since treatment is typically straightforward and cure rates are extremely high if we catch it early.   The first sign of melanoma is often a change in a mole or a new dark spot.  The ABCDE system is a way of remembering the signs of melanoma.  A for asymmetry:  The two halves do not match. B for border:  The edges of the growth are irregular. C for color:  A mixture of colors are present instead of an even brown color. D for diameter:  Melanomas are usually (but not always) greater than 6mm - the size of a pencil eraser. E for evolution:  The spot keeps changing in size, shape, and color.  Please check  your skin once per month between visits. You can use a small mirror in front and a large mirror behind you to keep an eye on the back side or your body.   If you see any new or changing lesions before your next follow-up, please call to schedule a visit.  Please continue daily skin protection including broad spectrum sunscreen SPF 30+ to sun-exposed areas, reapplying every 2 hours as needed when you're outdoors.   Staying in the shade or wearing long sleeves, sun glasses (UVA+UVB protection) and wide brim hats (4-inch brim around the entire circumference of the hat) are also  recommended for sun protection.       Due to recent changes in healthcare laws, you may see results of your pathology and/or laboratory studies on MyChart before the doctors have had a chance to review them. We understand that in some cases there may be results that are confusing or concerning to you. Please understand that not all results are received at the same time and often the doctors may need to interpret multiple results in order to provide you with the best plan of care or course of treatment. Therefore, we ask that you please give us  2 business days to thoroughly review all your results before contacting the office for clarification. Should we see a critical lab result, you will be contacted sooner.   If You Need Anything After Your Visit  If you have any questions or concerns for your doctor, please call our main line at [phone] and press option 4 to reach your doctor's medical assistant. If no one answers, please leave a voicemail as directed and we will return your call as soon as possible. Messages left after 4 pm will be answered the following business day.   You may also send us  a message via MyChart. We typically respond to MyChart messages within 1-2 business days.  For prescription refills, please ask your pharmacy to contact our office. Our fax number is [phone].  If you have an urgent issue when the clinic is closed that cannot wait until the next business day, you can page your doctor at the number below.    Please note that while we do our best to be available for urgent issues outside of office hours, we are not available 24/7.   If you have an urgent issue and are unable to reach us , you may choose to seek medical care at your doctor's office, retail clinic, urgent care center, or emergency room.  If you have a medical emergency, please immediately call 911 or go to the emergency department.  Pager Numbers  - Dr. Hester: [phone]  - Dr. Jackquline:  [phone]  - Dr. Claudene: [phone]   - Dr. Raymund: [phone]  In the event of inclement weather, please call our main line at [phone] for an update on the status of any delays or closures.  Dermatology Medication Tips: Please keep the boxes that topical medications come in in order to help keep track of the instructions about where and how to use these. Pharmacies typically print the medication instructions only on the boxes and not directly on the medication tubes.   If your medication is too expensive, please contact our office at [phone] option 4 or send us  a message through MyChart.   We are unable to tell what your co-pay for medications will be in advance as this is different depending on your insurance coverage. However, we may be able to find a substitute medication  at lower cost or fill out paperwork to get insurance to cover a needed medication.   If a prior authorization is required to get your medication covered by your insurance company, please allow us  1-2 business days to complete this process.  Drug prices often vary depending on where the prescription is filled and some pharmacies may offer cheaper prices.  The website www.goodrx.com contains coupons for medications through different pharmacies. The prices here do not account for what the cost may be with help from insurance (it may be cheaper with your insurance), but the website can give you the price if you did not use any insurance.  - You can print the associated coupon and take it with your prescription to the pharmacy.  - You may also stop by our office during regular business hours and pick up a GoodRx coupon card.  - If you need your prescription sent electronically to a different pharmacy, notify our office through [facility] or by phone at [phone] option 4.     Si Usted Necesita Algo Despus de Su Visita  Tambin puede enviarnos un mensaje a travs de Clinical cytogeneticist. Por lo general  respondemos a los mensajes de MyChart en el transcurso de 1 a 2 das hbiles.  Para renovar recetas, por favor pida a su farmacia que se ponga en contacto con nuestra oficina. Randi lakes de fax es [place] [phone].  Si tiene un asunto urgente cuando la clnica est cerrada y que no puede esperar hasta el siguiente da hbil, puede llamar/localizar a su doctor(a) al nmero que aparece a continuacin.   Por favor, tenga en cuenta que aunque hacemos todo lo posible para estar disponibles para asuntos urgentes fuera del horario de [place], no estamos disponibles las 24 horas del da, los 7 [address]  [address] de la [place].   Si tiene un problema urgente y no puede comunicarse con nosotros, puede optar por buscar atencin mdica  en el consultorio de su doctor(a), en una clnica privada, en un centro de atencin urgente o en una sala de emergencias.  Si tiene Engineer, drilling, por favor llame inmediatamente al 911 o vaya a la sala de emergencias.  Nmeros de bper  - Dr. Hester: [phone]  - Dra. Jackquline: [phone]  - Dr. Claudene: [phone]  - Dra. Kitts: [phone]  En caso de inclemencias del [place], por favor llame a nuestra lnea principal al [phone] para una actualizacin sobre el estado de cualquier retraso o cierre.  Consejos para la medicacin en dermatologa: Por favor, guarde las cajas en las que vienen los medicamentos de uso tpico para ayudarle a seguir las instrucciones sobre dnde y cmo usarlos. Las farmacias generalmente imprimen las instrucciones del medicamento slo en las cajas y no directamente en los tubos del [place].   Si su medicamento es muy caro, por favor, pngase en contacto con landry rieger llamando al [phone] y presione la opcin 4 o envenos un mensaje a travs de Clinical cytogeneticist.   No podemos decirle cul ser su copago por los medicamentos por adelantado ya que esto es diferente dependiendo de la cobertura de su seguro. Sin embargo, es posible  que podamos encontrar un medicamento sustituto a Audiological scientist un formulario para que el seguro cubra el medicamento que se considera necesario.   Si se requiere una autorizacin previa para que su compaa de seguros malta su medicamento, por favor permtanos de 1 a 2 das hbiles para completar este proceso.  Los precios de los medicamentos varan con frecuencia  dependiendo del lugar de dnde se surte la receta y alguna farmacias pueden ofrecer precios ms baratos.  El sitio web www.goodrx.com tiene cupones para medicamentos de Health and safety inspector. Los precios aqu no tienen en cuenta lo que podra costar con la ayuda del seguro (puede ser ms barato con su seguro), pero el sitio web puede darle el precio si no utiliz Tourist information centre manager.  - Puede imprimir el cupn correspondiente y llevarlo con su receta a la farmacia.  - Tambin puede pasar por nuestra oficina durante el horario de atencin regular y Education officer, museum una tarjeta de cupones de GoodRx.  - Si necesita que su receta se enve electrnicamente a una farmacia diferente, informe a nuestra oficina a travs de MyChart de [place] o por telfono llamando al [phone] y presione la opcin 4.

## 2023-09-17 NOTE — Progress Notes (Signed)
 New Patient Visit   Subjective  Cody Rush is a 68 y.o. male who presents for the following: Skin Cancer Screening and Full Body Skin Exam No hx of skin cancer, reports a spot at left ear other spots at face, chest, back, arms and legs he would like checked.   The patient presents for Total-Body Skin Exam (TBSE) for skin cancer screening and mole check. The patient has spots, moles and lesions to be evaluated, some may be new or changing and the patient may have concern these could be cancer.  The following portions of the chart were reviewed this encounter and updated as appropriate: medications, allergies, medical history  Review of Systems:  No other skin or systemic complaints except as noted in HPI or Assessment and Plan.  Objective  Well appearing patient in no apparent distress; mood and affect are within normal limits.  A full examination was performed including scalp, head, eyes, ears, nose, lips, neck, chest, axillae, abdomen, back, buttocks, bilateral upper extremities, bilateral lower extremities, hands, feet, fingers, toes, fingernails, and toenails. All findings within normal limits unless otherwise noted below.   Relevant physical exam findings are noted in the Assessment and Plan.  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 Condition is bothersome/symptomatic for patient. Currently flared. Atopic dermatitis (eczema) is a chronic, relapsing, pruritic condition that can significantly affect quality of life. It is often associated with allergic rhinitis and/or asthma and can require treatment with topical medications, phototherapy, or in severe cases biologic injectable medication  (Dupixent; Adbry) or Oral JAK inhibitors. Treatment Plan: Start mometasone  0.1 % cream - apply topically to affected area daily 3 to 5 days a week as needed for eczema and itch  Topical steroids (such as triamcinolone, fluocinolone, fluocinonide, mometasone , clobetasol, halobetasol, betamethasone, hydrocortisone ) can cause thinning and lightening of the skin if they are used for too long in the same area. Your physician has selected the right strength medicine for your problem and area affected on the body. Please use your medication only as directed by your physician to prevent side effects.   Recommend gentle skin care.   SEBORRHEIC DERMATITIS Exam: Pink patches with greasy scale at left earlobe  Chronic and persistent condition with duration or expected duration over one year. Condition is symptomatic/ bothersome to patient. Not currently at goal. Seborrheic Dermatitis is a chronic persistent rash characterized by pinkness and scaling most commonly of the mid face but also can occur on the scalp (dandruff), ears; mid chest, mid back and groin.  It tends to be exacerbated by stress and cooler weather.  People who have neurologic disease may experience new onset or exacerbation of existing seborrheic dermatitis.  The condition is not curable but treatable and can be controlled. Treatment Plan: Start mometasone  0.1 % cream - apply daily to affected area 3 to 5 days weekly as needed for seborrheic dermatitis   Topical steroids (such as triamcinolone, fluocinolone, fluocinonide, mometasone , clobetasol, halobetasol, betamethasone, hydrocortisone ) can cause thinning and lightening of the skin if they are used for too long in the same area. Your physician has selected the right strength medicine for your problem and area affected on the body. Please use your medication only as  directed by your physician to prevent side effects.    SKIN CANCER SCREENING PERFORMED TODAY.  ACTINIC DAMAGE - Chronic  condition, secondary to cumulative UV/sun exposure - diffuse scaly erythematous macules with underlying dyspigmentation - Recommend daily broad spectrum sunscreen SPF 30+ to sun-exposed areas, reapply every 2 hours as needed.  - Staying in the shade or wearing long sleeves, sun glasses (UVA+UVB protection) and wide brim hats (4-inch brim around the entire circumference of the hat) are also recommended for sun protection.  - Call for new or changing lesions.  LENTIGINES, SEBORRHEIC KERATOSES, HEMANGIOMAS - Benign normal skin lesions - Benign-appearing - Call for any changes  MELANOCYTIC NEVI - Tan-brown and/or pink-flesh-colored symmetric macules and papules - Benign appearing on exam today - Observation - Call clinic for new or changing moles - Recommend daily use of broad spectrum spf 30+ sunscreen to sun-exposed areas.   Varicose Veins/Spider Veins - Dilated blue, purple or red veins at the lower extremities - Reassured - Smaller vessels can be treated by sclerotherapy (a procedure to inject a medicine into the veins to make them disappear) if desired, but the treatment is not covered by insurance. Larger vessels may be covered if symptomatic and we would refer to vascular surgeon if treatment desired.  INFLAMED SEBORRHEIC KERATOSIS (8) right nose, b/l arms, and  right foot x 8 (8) Symptomatic, irritating, patient would like treated. Destruction of lesion - right nose, b/l arms, and  right foot x 8 (8) Complexity: simple   Destruction method: cryotherapy   Informed consent: discussed and consent obtained   Timeout:  patient name, date of birth, surgical site, and procedure verified Lesion destroyed using liquid nitrogen: Yes   Region frozen until ice ball extended beyond lesion: Yes   Outcome: patient tolerated procedure well with no complications   Post-procedure details: wound care instructions given    ATOPIC DERMATITIS, UNSPECIFIED TYPE   Related Medications mometasone   (ELOCON ) 0.1 % cream Apply topically to affected areas of rash at left earlobe and right foot daily 3 to 5 days a week as needed. SKIN CANCER SCREENING   ACTINIC SKIN DAMAGE   LENTIGO   MELANOCYTIC NEVUS, UNSPECIFIED LOCATION   SEBORRHEIC DERMATITIS   OTHER ATOPIC DERMATITIS    Return in about 1 year (around 09/16/2024) for TBSE.  IEleanor Blush, CMA, am acting as scribe for Alm Rhyme, MD.   Documentation: I have reviewed the above documentation for accuracy and completeness, and I agree with the above.  Alm Rhyme, MD

## 2023-11-12 ENCOUNTER — Other Ambulatory Visit: Payer: Self-pay

## 2023-11-12 DIAGNOSIS — C61 Malignant neoplasm of prostate: Secondary | ICD-10-CM

## 2023-11-13 ENCOUNTER — Other Ambulatory Visit: Payer: Self-pay

## 2023-11-13 DIAGNOSIS — C61 Malignant neoplasm of prostate: Secondary | ICD-10-CM

## 2023-11-14 LAB — FPSA% REFLEX
% FREE PSA: 17.8 %
PSA, FREE: 1.67 ng/mL

## 2023-11-14 LAB — PSA TOTAL (REFLEX TO FREE): Prostate Specific Ag, Serum: 9.4 ng/mL — ABNORMAL HIGH (ref 0.0–4.0)

## 2023-11-19 ENCOUNTER — Ambulatory Visit: Payer: Medicare HMO | Admitting: Urology

## 2023-11-25 ENCOUNTER — Ambulatory Visit: Admitting: Urology

## 2023-11-25 VITALS — BP 148/87 | HR 65

## 2023-11-25 DIAGNOSIS — C61 Malignant neoplasm of prostate: Secondary | ICD-10-CM

## 2023-11-25 DIAGNOSIS — N529 Male erectile dysfunction, unspecified: Secondary | ICD-10-CM | POA: Diagnosis not present

## 2023-11-25 MED ORDER — TADALAFIL 20 MG PO TABS: 20.0000 mg | ORAL_TABLET | ORAL | 5 refills | Status: AC | PRN

## 2023-11-25 NOTE — Patient Instructions (Signed)
 Please call [phone] or [phone] to schedule your imaging prior to your appointment.

## 2023-11-25 NOTE — Progress Notes (Signed)
   11/25/2023 8:46 AM   Cody Rush 01-20-1955 [id number]  Reason for visit: Follow up prostate cancer on active surveillance, ED  History: Initial diagnosis of low risk prostate cancer November 2020, PSA was 4.6 at that time with 20% free, biopsy showed 52 g prostate with PSA density 0.09, 2/12 cores with grade group 1 disease, 7% max core involvement Confirmatory biopsy August 2021 showed only benign tissue Using Cialis  20 mg as needed for ED with good results  Physical Exam: BP (!) 148/87 (BP Location: Left Arm, Patient Position: Sitting, Cuff Size: Normal)   Pulse 65   SpO2 98%   Imaging/labs: Most recent PSA October 2025 9.4, increased from 6.17 October 2022.  Does have a history of elevation to 7.9 in June 2023 that decreased to 6.4 on repeat.  Today: No urologic changes over the last year, denies any significant urinary symptoms Cialis  continues to work well for ED  Plan:   Low risk prostate cancer: Last biopsy was 2021, PSA has increased this year to 9.4 from 6.4 previously, does have a history of transient elevations.  We reviewed options including a repeat PSA, prostate MRI, or prostate biopsy.  I strongly recommended a prostate MRI based on time since his last biopsy and increase in the PSA, he is in agreement.  We discussed possible need for fusion biopsy if suspicious findings on MRI. ED: Cialis  refilled Call with MRI results   [name], MD  [facility] [address] [place], [place] [address] [phone]

## 2023-11-26 ENCOUNTER — Ambulatory Visit: Payer: Self-pay | Admitting: Urology

## 2023-12-03 ENCOUNTER — Ambulatory Visit
Admission: RE | Admit: 2023-12-03 | Discharge: 2023-12-03 | Disposition: A | Discharge status: Home / Self Care | Source: Ambulatory Visit | Attending: Urology | Admitting: Urology

## 2023-12-03 DIAGNOSIS — C61 Malignant neoplasm of prostate: Secondary | ICD-10-CM | POA: Diagnosis present

## 2023-12-03 MED ORDER — GADOBUTROL 1 MMOL/ML IV SOLN
8.0000 mL | Freq: Once | INTRAVENOUS | Status: AC | PRN
  Administered 2023-12-03: 8 mL via INTRAVENOUS

## 2023-12-07 ENCOUNTER — Ambulatory Visit: Payer: Self-pay | Admitting: Urology

## 2023-12-07 DIAGNOSIS — C61 Malignant neoplasm of prostate: Secondary | ICD-10-CM

## 2023-12-07 NOTE — Telephone Encounter (Signed)
 Appt scheduled. PSA ordered.

## 2024-03-08 ENCOUNTER — Other Ambulatory Visit

## 2024-06-07 ENCOUNTER — Ambulatory Visit: Admitting: Urology

## 2024-09-20 ENCOUNTER — Ambulatory Visit: Admitting: Dermatology
